# Patient Record
Sex: Male | Born: 1950
Health system: Southern US, Community
[De-identification: ages and names within clinical notes are randomized; demographics above are authoritative.]

## PROBLEM LIST (undated history)

## (undated) DIAGNOSIS — I10 Essential (primary) hypertension: Secondary | ICD-10-CM

## (undated) DIAGNOSIS — M1712 Unilateral primary osteoarthritis, left knee: Secondary | ICD-10-CM

## (undated) DIAGNOSIS — E871 Hypo-osmolality and hyponatremia: Secondary | ICD-10-CM

## (undated) DIAGNOSIS — L602 Onychogryphosis: Secondary | ICD-10-CM

## (undated) DIAGNOSIS — M25561 Pain in right knee: Secondary | ICD-10-CM

## (undated) DIAGNOSIS — D509 Iron deficiency anemia, unspecified: Secondary | ICD-10-CM

## (undated) DIAGNOSIS — I7121 Aneurysm of the ascending aorta, without rupture: Secondary | ICD-10-CM

## (undated) DIAGNOSIS — M25562 Pain in left knee: Secondary | ICD-10-CM

## (undated) HISTORY — DX: Iron deficiency anemia, unspecified: D50.9

## (undated) HISTORY — PX: ORCHIECTOMY: SHX2116

## (undated) HISTORY — DX: Aneurysm of the ascending aorta, without rupture: I71.21

## (undated) HISTORY — DX: Unilateral primary osteoarthritis, left knee: M17.12

## (undated) HISTORY — DX: Onychogryphosis: L60.2

## (undated) HISTORY — DX: Essential (primary) hypertension: I10

## (undated) HISTORY — DX: Hypo-osmolality and hyponatremia: E87.1

## (undated) HISTORY — DX: Pain in right knee: M25.561

## (undated) HISTORY — DX: Pain in left knee: M25.562

---

## 2015-02-12 DIAGNOSIS — M25562 Pain in left knee: Secondary | ICD-10-CM

## 2015-02-12 DIAGNOSIS — M25561 Pain in right knee: Secondary | ICD-10-CM | POA: Insufficient documentation

## 2015-02-12 HISTORY — DX: Pain in right knee: M25.561

## 2015-02-12 HISTORY — DX: Pain in left knee: M25.562

## 2015-03-15 DIAGNOSIS — M1712 Unilateral primary osteoarthritis, left knee: Secondary | ICD-10-CM

## 2015-03-15 HISTORY — DX: Unilateral primary osteoarthritis, left knee: M17.12

## 2016-12-10 DIAGNOSIS — R69 Illness, unspecified: Secondary | ICD-10-CM | POA: Diagnosis not present

## 2017-01-01 DIAGNOSIS — G8929 Other chronic pain: Secondary | ICD-10-CM | POA: Diagnosis not present

## 2017-01-01 DIAGNOSIS — M21161 Varus deformity, not elsewhere classified, right knee: Secondary | ICD-10-CM | POA: Diagnosis not present

## 2017-01-01 DIAGNOSIS — M1711 Unilateral primary osteoarthritis, right knee: Secondary | ICD-10-CM | POA: Diagnosis not present

## 2017-01-01 DIAGNOSIS — M170 Bilateral primary osteoarthritis of knee: Secondary | ICD-10-CM | POA: Diagnosis not present

## 2017-01-01 DIAGNOSIS — M25561 Pain in right knee: Secondary | ICD-10-CM | POA: Diagnosis not present

## 2017-03-17 DIAGNOSIS — R262 Difficulty in walking, not elsewhere classified: Secondary | ICD-10-CM | POA: Diagnosis not present

## 2017-03-17 DIAGNOSIS — M25561 Pain in right knee: Secondary | ICD-10-CM | POA: Diagnosis not present

## 2017-03-17 DIAGNOSIS — M170 Bilateral primary osteoarthritis of knee: Secondary | ICD-10-CM | POA: Diagnosis not present

## 2017-03-17 DIAGNOSIS — M25562 Pain in left knee: Secondary | ICD-10-CM | POA: Diagnosis not present

## 2017-03-19 DIAGNOSIS — Z0000 Encounter for general adult medical examination without abnormal findings: Secondary | ICD-10-CM | POA: Diagnosis not present

## 2017-03-19 DIAGNOSIS — I10 Essential (primary) hypertension: Secondary | ICD-10-CM | POA: Diagnosis not present

## 2017-03-19 DIAGNOSIS — K08409 Partial loss of teeth, unspecified cause, unspecified class: Secondary | ICD-10-CM | POA: Diagnosis not present

## 2017-03-19 DIAGNOSIS — E669 Obesity, unspecified: Secondary | ICD-10-CM | POA: Diagnosis not present

## 2017-03-19 DIAGNOSIS — M25561 Pain in right knee: Secondary | ICD-10-CM | POA: Diagnosis not present

## 2017-03-19 DIAGNOSIS — M25562 Pain in left knee: Secondary | ICD-10-CM | POA: Diagnosis not present

## 2017-03-19 DIAGNOSIS — R0600 Dyspnea, unspecified: Secondary | ICD-10-CM | POA: Diagnosis not present

## 2017-03-19 DIAGNOSIS — Z7982 Long term (current) use of aspirin: Secondary | ICD-10-CM | POA: Diagnosis not present

## 2017-03-19 DIAGNOSIS — Z79899 Other long term (current) drug therapy: Secondary | ICD-10-CM | POA: Diagnosis not present

## 2017-03-19 DIAGNOSIS — Z6835 Body mass index (BMI) 35.0-35.9, adult: Secondary | ICD-10-CM | POA: Diagnosis not present

## 2017-03-26 DIAGNOSIS — M25562 Pain in left knee: Secondary | ICD-10-CM | POA: Diagnosis not present

## 2017-03-26 DIAGNOSIS — M25561 Pain in right knee: Secondary | ICD-10-CM | POA: Diagnosis not present

## 2017-03-26 DIAGNOSIS — M1711 Unilateral primary osteoarthritis, right knee: Secondary | ICD-10-CM | POA: Diagnosis not present

## 2017-03-26 DIAGNOSIS — R262 Difficulty in walking, not elsewhere classified: Secondary | ICD-10-CM | POA: Diagnosis not present

## 2017-03-26 DIAGNOSIS — M170 Bilateral primary osteoarthritis of knee: Secondary | ICD-10-CM | POA: Diagnosis not present

## 2017-03-30 DIAGNOSIS — R69 Illness, unspecified: Secondary | ICD-10-CM | POA: Diagnosis not present

## 2017-03-30 DIAGNOSIS — E785 Hyperlipidemia, unspecified: Secondary | ICD-10-CM | POA: Diagnosis not present

## 2017-03-30 DIAGNOSIS — Z1389 Encounter for screening for other disorder: Secondary | ICD-10-CM | POA: Diagnosis not present

## 2017-03-30 DIAGNOSIS — I10 Essential (primary) hypertension: Secondary | ICD-10-CM | POA: Diagnosis not present

## 2017-03-30 DIAGNOSIS — M170 Bilateral primary osteoarthritis of knee: Secondary | ICD-10-CM | POA: Diagnosis not present

## 2017-03-30 DIAGNOSIS — J449 Chronic obstructive pulmonary disease, unspecified: Secondary | ICD-10-CM | POA: Diagnosis not present

## 2017-03-30 DIAGNOSIS — Z9181 History of falling: Secondary | ICD-10-CM | POA: Diagnosis not present

## 2017-03-30 DIAGNOSIS — R7301 Impaired fasting glucose: Secondary | ICD-10-CM | POA: Diagnosis not present

## 2017-03-30 DIAGNOSIS — Z6835 Body mass index (BMI) 35.0-35.9, adult: Secondary | ICD-10-CM | POA: Diagnosis not present

## 2017-03-31 DIAGNOSIS — M1711 Unilateral primary osteoarthritis, right knee: Secondary | ICD-10-CM | POA: Diagnosis not present

## 2017-03-31 DIAGNOSIS — M25561 Pain in right knee: Secondary | ICD-10-CM | POA: Diagnosis not present

## 2017-04-22 DIAGNOSIS — M25561 Pain in right knee: Secondary | ICD-10-CM | POA: Diagnosis not present

## 2017-04-22 DIAGNOSIS — M1711 Unilateral primary osteoarthritis, right knee: Secondary | ICD-10-CM | POA: Diagnosis not present

## 2017-04-29 DIAGNOSIS — M170 Bilateral primary osteoarthritis of knee: Secondary | ICD-10-CM | POA: Diagnosis not present

## 2017-04-29 DIAGNOSIS — M25562 Pain in left knee: Secondary | ICD-10-CM | POA: Diagnosis not present

## 2017-04-29 DIAGNOSIS — M25561 Pain in right knee: Secondary | ICD-10-CM | POA: Diagnosis not present

## 2017-05-14 DIAGNOSIS — M1712 Unilateral primary osteoarthritis, left knee: Secondary | ICD-10-CM | POA: Diagnosis not present

## 2017-05-14 DIAGNOSIS — M25562 Pain in left knee: Secondary | ICD-10-CM | POA: Diagnosis not present

## 2017-05-21 DIAGNOSIS — M1712 Unilateral primary osteoarthritis, left knee: Secondary | ICD-10-CM | POA: Diagnosis not present

## 2017-05-21 DIAGNOSIS — M25562 Pain in left knee: Secondary | ICD-10-CM | POA: Diagnosis not present

## 2017-06-01 DIAGNOSIS — M25461 Effusion, right knee: Secondary | ICD-10-CM | POA: Diagnosis not present

## 2017-06-01 DIAGNOSIS — M25562 Pain in left knee: Secondary | ICD-10-CM | POA: Diagnosis not present

## 2017-06-01 DIAGNOSIS — M1712 Unilateral primary osteoarthritis, left knee: Secondary | ICD-10-CM | POA: Diagnosis not present

## 2017-06-09 DIAGNOSIS — M25562 Pain in left knee: Secondary | ICD-10-CM | POA: Diagnosis not present

## 2017-06-09 DIAGNOSIS — M1712 Unilateral primary osteoarthritis, left knee: Secondary | ICD-10-CM | POA: Diagnosis not present

## 2017-08-31 DIAGNOSIS — R7301 Impaired fasting glucose: Secondary | ICD-10-CM | POA: Diagnosis not present

## 2017-08-31 DIAGNOSIS — E785 Hyperlipidemia, unspecified: Secondary | ICD-10-CM | POA: Diagnosis not present

## 2017-08-31 DIAGNOSIS — Z125 Encounter for screening for malignant neoplasm of prostate: Secondary | ICD-10-CM | POA: Diagnosis not present

## 2017-10-28 DIAGNOSIS — R69 Illness, unspecified: Secondary | ICD-10-CM | POA: Diagnosis not present

## 2017-12-02 DIAGNOSIS — M25562 Pain in left knee: Secondary | ICD-10-CM | POA: Diagnosis not present

## 2017-12-02 DIAGNOSIS — M170 Bilateral primary osteoarthritis of knee: Secondary | ICD-10-CM | POA: Diagnosis not present

## 2017-12-02 DIAGNOSIS — M25561 Pain in right knee: Secondary | ICD-10-CM | POA: Diagnosis not present

## 2017-12-02 DIAGNOSIS — M25462 Effusion, left knee: Secondary | ICD-10-CM | POA: Diagnosis not present

## 2017-12-02 DIAGNOSIS — M25461 Effusion, right knee: Secondary | ICD-10-CM | POA: Diagnosis not present

## 2017-12-10 DIAGNOSIS — R221 Localized swelling, mass and lump, neck: Secondary | ICD-10-CM | POA: Diagnosis not present

## 2017-12-10 DIAGNOSIS — R0609 Other forms of dyspnea: Secondary | ICD-10-CM | POA: Diagnosis not present

## 2017-12-10 DIAGNOSIS — Z6836 Body mass index (BMI) 36.0-36.9, adult: Secondary | ICD-10-CM | POA: Diagnosis not present

## 2017-12-17 DIAGNOSIS — M1712 Unilateral primary osteoarthritis, left knee: Secondary | ICD-10-CM | POA: Diagnosis not present

## 2017-12-17 DIAGNOSIS — M25562 Pain in left knee: Secondary | ICD-10-CM | POA: Diagnosis not present

## 2017-12-18 DIAGNOSIS — R0609 Other forms of dyspnea: Secondary | ICD-10-CM | POA: Diagnosis not present

## 2017-12-18 DIAGNOSIS — R221 Localized swelling, mass and lump, neck: Secondary | ICD-10-CM | POA: Diagnosis not present

## 2017-12-24 DIAGNOSIS — M25562 Pain in left knee: Secondary | ICD-10-CM | POA: Diagnosis not present

## 2017-12-24 DIAGNOSIS — M1712 Unilateral primary osteoarthritis, left knee: Secondary | ICD-10-CM | POA: Diagnosis not present

## 2017-12-31 DIAGNOSIS — M25562 Pain in left knee: Secondary | ICD-10-CM | POA: Diagnosis not present

## 2017-12-31 DIAGNOSIS — M1712 Unilateral primary osteoarthritis, left knee: Secondary | ICD-10-CM | POA: Diagnosis not present

## 2018-01-07 DIAGNOSIS — M25562 Pain in left knee: Secondary | ICD-10-CM | POA: Diagnosis not present

## 2018-01-07 DIAGNOSIS — M1712 Unilateral primary osteoarthritis, left knee: Secondary | ICD-10-CM | POA: Diagnosis not present

## 2018-01-20 DIAGNOSIS — M25561 Pain in right knee: Secondary | ICD-10-CM | POA: Diagnosis not present

## 2018-01-20 DIAGNOSIS — M25461 Effusion, right knee: Secondary | ICD-10-CM | POA: Diagnosis not present

## 2018-01-20 DIAGNOSIS — M1711 Unilateral primary osteoarthritis, right knee: Secondary | ICD-10-CM | POA: Diagnosis not present

## 2018-01-28 DIAGNOSIS — M25561 Pain in right knee: Secondary | ICD-10-CM | POA: Diagnosis not present

## 2018-01-28 DIAGNOSIS — M1711 Unilateral primary osteoarthritis, right knee: Secondary | ICD-10-CM | POA: Diagnosis not present

## 2018-02-11 DIAGNOSIS — M1711 Unilateral primary osteoarthritis, right knee: Secondary | ICD-10-CM | POA: Diagnosis not present

## 2018-02-11 DIAGNOSIS — M25561 Pain in right knee: Secondary | ICD-10-CM | POA: Diagnosis not present

## 2018-02-18 DIAGNOSIS — M25561 Pain in right knee: Secondary | ICD-10-CM | POA: Diagnosis not present

## 2018-02-18 DIAGNOSIS — M1711 Unilateral primary osteoarthritis, right knee: Secondary | ICD-10-CM | POA: Diagnosis not present

## 2018-06-28 DIAGNOSIS — M25462 Effusion, left knee: Secondary | ICD-10-CM | POA: Diagnosis not present

## 2018-06-28 DIAGNOSIS — M25561 Pain in right knee: Secondary | ICD-10-CM | POA: Diagnosis not present

## 2018-06-28 DIAGNOSIS — M25562 Pain in left knee: Secondary | ICD-10-CM | POA: Diagnosis not present

## 2018-06-28 DIAGNOSIS — M170 Bilateral primary osteoarthritis of knee: Secondary | ICD-10-CM | POA: Diagnosis not present

## 2018-06-28 DIAGNOSIS — M25461 Effusion, right knee: Secondary | ICD-10-CM | POA: Diagnosis not present

## 2018-07-22 ENCOUNTER — Encounter: Payer: Self-pay | Admitting: Cardiology

## 2018-07-22 ENCOUNTER — Ambulatory Visit: Payer: Medicare HMO | Admitting: Cardiology

## 2018-07-22 VITALS — BP 118/64 | HR 92 | Ht 70.0 in | Wt 253.0 lb

## 2018-07-22 DIAGNOSIS — I10 Essential (primary) hypertension: Secondary | ICD-10-CM | POA: Diagnosis not present

## 2018-07-22 DIAGNOSIS — R0609 Other forms of dyspnea: Secondary | ICD-10-CM | POA: Diagnosis not present

## 2018-07-22 DIAGNOSIS — R0600 Dyspnea, unspecified: Secondary | ICD-10-CM

## 2018-07-22 DIAGNOSIS — R0683 Snoring: Secondary | ICD-10-CM

## 2018-07-22 HISTORY — DX: Snoring: R06.83

## 2018-07-22 NOTE — Progress Notes (Signed)
Cardiology Consultation:    Date:  07/22/2018   ID:  Joshua ReinJames Creasey V., DOB 1950-12-12, MRN [id number]  PCP:  Paulina FusiSchultz, Douglas E, MD  Cardiologist:  Gypsy Balsamobert Kember Boch, MD   Referring MD: No ref. provider found   No chief complaint on file. Here to be reestablished as a patient  History of Present Illness:    Joshua ReinJames Trabert V. is a 67 y.o. male who is being seen today for the evaluation of with history of coronary artery disease at the request of No ref. provider found.  Apparently was seen him in our office in 2012 he ended up [facility] he was told to have myocardial infarction then he was sent to [facility] where cardiac catheterization was done apparently no significant lesion was identified and no intervention has been required.  He was told that somebody Mr. had a test that he had done.  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  He described to have some pain in the left side of his neck that happened when he sits in the chair.  He does have problem with blood pressure recent adjustment to the medication has been done and since that time he complained of having dizziness when he gets up very quickly.  No passing out no falls.  Past Medical History:  Diagnosis Date  . Arthralgia of both knees 02/12/2015  . Primary osteoarthritis of left knee 03/15/2015    Past Surgical History:  Procedure Laterality Date  . ORCHIECTOMY      Current Medications: Current Meds  Medication Sig  . aspirin EC 81 MG tablet Take 1 tablet by mouth daily.  . celecoxib (CELEBREX) 200 MG capsule Take 1 capsule by mouth daily.  . chlordiazePOXIDE (LIBRIUM) 25 MG capsule 100 mg 4 times a day Day1, 75 mg 4 times a day Day2, 50 mg 4 times a day Day3, 25 mg 4 times a day Day4   . Garlic 1000 MG CAPS Take 1 capsule by mouth daily.  Marland Kitchen. olmesartan (BENICAR) 40 MG tablet Take 40 mg by mouth daily.  Marland Kitchen. SPIRIVA HANDIHALER 18 MCG inhalation capsule Place 1 puff into inhaler and inhale daily.     Allergies:   Patient has no known allergies.   Social History   Socioeconomic History  . Marital status: Married    Spouse name: Not on file  . Number of children: Not on file  . Years of education: Not on file  . Highest education level: Not on file  Occupational History  . Not on file  Social Needs  . Financial resource strain: Not on file  . Food insecurity:    Worry: Not on file    Inability: Not on file  . Transportation needs:    Medical: Not on file    Non-medical: Not on file  Tobacco Use  . Smoking status: Never Smoker  . Smokeless tobacco: Never Used  Substance and Sexual Activity  . Alcohol use: Not on file  . Drug use: Not on file  . Sexual activity: Not on file  Lifestyle  . Physical activity:    Days per week: Not on file    Minutes per session: Not on file  . Stress: Not on file  Relationships  .  Social connections:    Talks on phone: Not on file    Gets together: Not on file    Attends religious service: Not on file    Active member of club or organization: Not on file    Attends meetings of clubs or organizations: Not on file    Relationship status: Not on file  Other Topics Concern  . Not on file  Social History Narrative  . Not on file     Family History: The patient's family history includes Alzheimer's disease in his father; Heart Problems in his sister; Stroke in his mother. ROS:   Please see the history of present illness.    All 14 point review of systems negative except as described per history of present illness.  EKGs/Labs/Other Studies Reviewed:    The following studies were reviewed today:   EKG:  EKG is normal sinus rhythm normal PR interval left axis deviation nonspecific ST segment changes ordered today.     Recent Labs: No results found for requested labs within last 8760 hours.  Recent Lipid Panel No results found for: CHOL, TRIG, HDL, CHOLHDL, VLDL, LDLCALC, LDLDIRECT  Physical Exam:    VS:  BP 118/64 (BP Location: Right Arm, Patient Position: Sitting, Cuff Size: Normal)   Pulse 92   Ht 5\' 10"  (1.778 m)   Wt 253 lb (114.8 kg)   SpO2 98%   BMI 36.30 kg/m     Wt Readings from Last 3 Encounters:  07/22/18 253 lb (114.8 kg)     GEN:  Well nourished, well developed in no acute distress HEENT: Normal NECK: No JVD; No carotid bruits LYMPHATICS: No lymphadenopathy CARDIAC: RRR, no murmurs, no rubs, no gallops RESPIRATORY:  Clear to auscultation without rales, wheezing or rhonchi  ABDOMEN: Soft, non-tender, non-distended MUSCULOSKELETAL:  No edema; No deformity  SKIN: Warm and dry NEUROLOGIC:  Alert and oriented x 3 PSYCHIATRIC:  Normal affect   ASSESSMENT:    1. Hypertension, unspecified type   2. Essential hypertension   3. Dyspnea on exertion   4. Snoring    PLAN:    In order of problems listed above:  1. Hypertension his blood pressure appears to be well controlled I am concerned about his symptoms dizziness while getting up.  I told him that he may cut his ARB are being in a half.  However I also explained to him what the reasons for his medication and the importance of taking care of his blood pressure well. 2. Snoring was spent great deal of of time talking about the fact that he needs sleep study he said he want to think about it.  I will ask him to have an echocardiogram done to assess left ventricular ejection fraction and more importantly pulmonary pressure. 3. Dyspnea on exertion echocardiogram will be done to assess left ventricular ejection fraction.   Medication Adjustments/Labs and Tests Ordered: Current medicines are reviewed at length with the patient today.  Concerns regarding medicines are outlined above.  Orders Placed This Encounter  Procedures  .  EKG 12-Lead  . ECHOCARDIOGRAM COMPLETE   No orders of the defined types were placed in this encounter.   Signed, Georgeanna Lea, MD, [facility]. 07/22/2018 5:02 PM    [facility]

## 2018-07-22 NOTE — Patient Instructions (Signed)
Medication Instructions:  Your physician recommends that you continue on your current medications as directed. Please refer to the Current Medication list given to you today.   Labwork: None   Testing/Procedures: You had an EKG today.   Your physician has requested that you have an echocardiogram. Echocardiography is a painless test that uses sound waves to create images of your heart. It provides your doctor with information about the size and shape of your heart and how well your heart's chambers and valves are working. This procedure takes approximately one hour. There are no restrictions for this procedure.    Follow-Up: Your physician wants you to follow-up in: 4 months. You will receive a reminder letter in the mail two months in advance. If you don't receive a letter, please call our office to schedule the follow-up appointment.   If you need a refill on your cardiac medications before your next appointment, please call your pharmacy.   Thank you for choosing [facility]! Loree Shehata, RN [phone]   Echocardiogram An echocardiogram, or echocardiography, uses sound waves (ultrasound) to produce an image of your heart. The echocardiogram is simple, painless, obtained within a short period of time, and offers valuable information to your health care provider. The images from an echocardiogram can provide information such as:  Evidence of coronary artery disease (CAD).  Heart size.  Heart muscle function.  Heart valve function.  Aneurysm detection.  Evidence of a past heart attack.  Fluid buildup around the heart.  Heart muscle thickening.  Assess heart valve function.  Tell a health care provider about:  Any allergies you have.  All medicines you are taking, including vitamins, herbs, eye drops, creams, and over-the-counter medicines.  Any problems you or family members have had with anesthetic medicines.  Any blood disorders you have.  Any  surgeries you have had.  Any medical conditions you have.  Whether you are pregnant or may be pregnant. What happens before the procedure? No special preparation is needed. Eat and drink normally. What happens during the procedure?  In order to produce an image of your heart, gel will be applied to your chest and a wand-like tool (transducer) will be moved over your chest. The gel will help transmit the sound waves from the transducer. The sound waves will harmlessly bounce off your heart to allow the heart images to be captured in real-time motion. These images will then be recorded.  You may need an IV to receive a medicine that improves the quality of the pictures. What happens after the procedure? You may return to your normal schedule including diet, activities, and medicines, unless your health care provider tells you otherwise. This information is not intended to replace advice given to you by your health care provider. Make sure you discuss any questions you have with your health care provider. Document Released: 11/14/2000 Document Revised: 07/05/2016 Document Reviewed: 07/25/2013 Elsevier Interactive Patient Education  2017 Elsevier Inc.  

## 2018-09-08 ENCOUNTER — Other Ambulatory Visit: Payer: Self-pay

## 2018-09-08 ENCOUNTER — Ambulatory Visit (INDEPENDENT_AMBULATORY_CARE_PROVIDER_SITE_OTHER): Payer: Medicare HMO

## 2018-09-08 DIAGNOSIS — I10 Essential (primary) hypertension: Secondary | ICD-10-CM

## 2018-09-08 NOTE — Progress Notes (Signed)
Complete echocardiogram has been performed.  Jimmy Amaiyah Nordhoff RDCS, RVT 

## 2018-09-10 ENCOUNTER — Telehealth: Payer: Self-pay | Admitting: Emergency Medicine

## 2018-09-10 DIAGNOSIS — I7121 Aneurysm of the ascending aorta, without rupture: Secondary | ICD-10-CM

## 2018-09-10 DIAGNOSIS — I712 Thoracic aortic aneurysm, without rupture: Secondary | ICD-10-CM

## 2018-09-10 DIAGNOSIS — I10 Essential (primary) hypertension: Secondary | ICD-10-CM

## 2018-09-10 DIAGNOSIS — I7789 Other specified disorders of arteries and arterioles: Secondary | ICD-10-CM

## 2018-09-10 NOTE — Telephone Encounter (Signed)
Patient informed of echocardiogram results and  to have labs drawn and a ct of his aorta per Dr. Bing Matter. Patient verbally understands.

## 2018-09-13 DIAGNOSIS — I712 Thoracic aortic aneurysm, without rupture: Secondary | ICD-10-CM | POA: Diagnosis not present

## 2018-09-13 DIAGNOSIS — I10 Essential (primary) hypertension: Secondary | ICD-10-CM | POA: Diagnosis not present

## 2018-09-14 LAB — BASIC METABOLIC PANEL
BUN / CREAT RATIO: 13 (ref 10–24)
BUN: 9 mg/dL (ref 8–27)
CO2: 23 mmol/L (ref 20–29)
CREATININE: 0.69 mg/dL — AB (ref 0.76–1.27)
Calcium: 8.8 mg/dL (ref 8.6–10.2)
Chloride: 102 mmol/L (ref 96–106)
GFR, EST AFRICAN AMERICAN: 112 mL/min/{1.73_m2} (ref >59)
GFR, EST NON AFRICAN AMERICAN: 97 mL/min/{1.73_m2} (ref >59)
GLUCOSE: 90 mg/dL (ref 65–99)
Potassium: 4 mmol/L (ref 3.5–5.2)
Sodium: 140 mmol/L (ref 134–144)

## 2018-09-23 ENCOUNTER — Ambulatory Visit (INDEPENDENT_AMBULATORY_CARE_PROVIDER_SITE_OTHER)
Admission: RE | Admit: 2018-09-23 | Discharge: 2018-09-23 | Disposition: A | Discharge status: Home / Self Care | Payer: Medicare HMO | Source: Ambulatory Visit | Attending: Cardiology | Admitting: Cardiology

## 2018-09-23 DIAGNOSIS — I712 Thoracic aortic aneurysm, without rupture: Secondary | ICD-10-CM

## 2018-09-23 DIAGNOSIS — I77810 Thoracic aortic ectasia: Secondary | ICD-10-CM | POA: Diagnosis not present

## 2018-09-23 DIAGNOSIS — I7121 Aneurysm of the ascending aorta, without rupture: Secondary | ICD-10-CM

## 2018-09-23 MED ORDER — IOPAMIDOL (ISOVUE-370) INJECTION 76%
100.0000 mL | Freq: Once | INTRAVENOUS | Status: AC | PRN
  Administered 2018-09-23: 100 mL via INTRAVENOUS

## 2018-09-24 ENCOUNTER — Telehealth: Payer: Self-pay | Admitting: Cardiology

## 2018-09-24 NOTE — Telephone Encounter (Signed)
Will route to Dr. Bing Matter for results.

## 2018-09-24 NOTE — Telephone Encounter (Signed)
Mild enlargement of the aortic root , 4.2 cm  Medical therapy

## 2018-09-24 NOTE — Telephone Encounter (Signed)
Please call patient with results of CT done yesterday. If he does not answer please leave a message if no answer.

## 2018-09-24 NOTE — Telephone Encounter (Signed)
Patient informed of results.  

## 2018-10-04 ENCOUNTER — Ambulatory Visit: Payer: Medicare HMO | Admitting: Cardiology

## 2018-10-13 DIAGNOSIS — Z23 Encounter for immunization: Secondary | ICD-10-CM | POA: Diagnosis not present

## 2018-11-25 ENCOUNTER — Encounter: Payer: Self-pay | Admitting: Cardiology

## 2018-11-25 ENCOUNTER — Ambulatory Visit (INDEPENDENT_AMBULATORY_CARE_PROVIDER_SITE_OTHER): Payer: Medicare HMO | Admitting: Cardiology

## 2018-11-25 VITALS — BP 134/78 | HR 83 | Ht 70.0 in | Wt 261.4 lb

## 2018-11-25 DIAGNOSIS — I10 Essential (primary) hypertension: Secondary | ICD-10-CM | POA: Diagnosis not present

## 2018-11-25 DIAGNOSIS — R0609 Other forms of dyspnea: Secondary | ICD-10-CM | POA: Diagnosis not present

## 2018-11-25 DIAGNOSIS — R0600 Dyspnea, unspecified: Secondary | ICD-10-CM

## 2018-11-25 DIAGNOSIS — I712 Thoracic aortic aneurysm, without rupture: Secondary | ICD-10-CM | POA: Diagnosis not present

## 2018-11-25 DIAGNOSIS — R0683 Snoring: Secondary | ICD-10-CM | POA: Diagnosis not present

## 2018-11-25 DIAGNOSIS — I7121 Aneurysm of the ascending aorta, without rupture: Secondary | ICD-10-CM | POA: Insufficient documentation

## 2018-11-25 MED ORDER — ROSUVASTATIN CALCIUM 10 MG PO TABS: 10.0000 mg | ORAL_TABLET | Freq: Every day | ORAL | 3 refills | Status: DC

## 2018-11-25 NOTE — Progress Notes (Signed)
Cardiology Office Note:    Date:  11/25/2018   ID:  Joshua ReinJames Slutsky V., DOB May 06, 1951, MRN [id number]  PCP:  Paulina FusiSchultz, Douglas E, MD  Cardiologist:  Gypsy Balsamobert Artemisia Auvil, MD    Referring MD: Paulina FusiSchultz, Douglas E, MD   Chief Complaint  Patient presents with  . Follow-up  Doing well  History of Present Illness:    Joshua ReinJames Harshfield V. is a 67 y.o. male with remote history of myocardial infarction.  That happened in 2010 he did have some biochemical markers abnormality, he was sent [facility].  Cardiac catheterization was done at that time and he was fine normal coronaries.  He was told that the blood test was mistakenly read.  Denies having any chest pain tightness squeezing pressure burning chest he does have some exertional fatigue and tiredness sometimes will get some twinges in the left side of the chest interestingly he talks today about snoring his wife is in the room and clearly from the description she gave me he does have sleep apnea stop breathing at night chokes move jumps.  We talked at length about this and I strongly advised him to have sleep study he agreed.  Past Medical History:  Diagnosis Date  . Arthralgia of both knees 02/12/2015  . Primary osteoarthritis of left knee 03/15/2015    Past Surgical History:  Procedure Laterality Date  . ORCHIECTOMY      Current Medications: Current Meds  Medication Sig  . aspirin EC 81 MG tablet Take 1 tablet by mouth daily.  . celecoxib (CELEBREX) 200 MG capsule Take 1 capsule by mouth daily.  . Garlic 1000 MG CAPS Take 1 capsule by mouth daily.  Marland Kitchen. olmesartan (BENICAR) 40 MG tablet Take 20 mg by mouth daily.   Marland Kitchen. SPIRIVA HANDIHALER 18 MCG inhalation capsule Place 1 puff into inhaler and inhale daily.     Allergies:   Patient has no known allergies.   Social History   Socioeconomic History  . Marital status: Married    Spouse name: Not on file  . Number of children: Not on file  . Years of education: Not on file   . Highest education level: Not on file  Occupational History  . Not on file  Social Needs  . Financial resource strain: Not on file  . Food insecurity:    Worry: Not on file    Inability: Not on file  . Transportation needs:    Medical: Not on file    Non-medical: Not on file  Tobacco Use  . Smoking status: Never Smoker  . Smokeless tobacco: Never Used  Substance and Sexual Activity  . Alcohol use: Not on file  . Drug use: Not on file  . Sexual activity: Not on file  Lifestyle  . Physical activity:    Days per week: Not on file    Minutes per session: Not on file  . Stress: Not on file  Relationships  . Social connections:    Talks on phone: Not on file    Gets together: Not on file    Attends religious service: Not on file    Active member of club or organization: Not on file    Attends meetings of clubs or organizations: Not on file    Relationship status: Not on file  Other Topics Concern  . Not on file  Social History Narrative  . Not on file     Family History: The patient's family history includes Alzheimer's disease in his  father; Heart Problems in his sister; Stroke in his mother. ROS:   Please see the history of present illness.    All 14 point review of systems negative except as described per history of present illness  EKGs/Labs/Other Studies Reviewed:      Recent Labs: 09/13/2018: BUN 9; Creatinine, Ser 0.69; Potassium 4.0; Sodium 140  Recent Lipid Panel No results found for: CHOL, TRIG, HDL, CHOLHDL, VLDL, LDLCALC, LDLDIRECT  Physical Exam:    VS:  BP 134/78   Pulse 83   Ht 5\' 10"  (1.778 m)   Wt 261 lb 6.4 oz (118.6 kg)   SpO2 96%   BMI 37.51 kg/m     Wt Readings from Last 3 Encounters:  11/25/18 261 lb 6.4 oz (118.6 kg)  07/22/18 253 lb (114.8 kg)     GEN:  Well nourished, well developed in no acute distress HEENT: Normal NECK: No JVD; No carotid bruits LYMPHATICS: No lymphadenopathy CARDIAC: RRR, no murmurs, no rubs, no  gallops RESPIRATORY:  Clear to auscultation without rales, wheezing or rhonchi  ABDOMEN: Soft, non-tender, non-distended MUSCULOSKELETAL:  No edema; No deformity  SKIN: Warm and dry LOWER EXTREMITIES: no swelling NEUROLOGIC:  Alert and oriented x 3 PSYCHIATRIC:  Normal affect   ASSESSMENT:    1. Snoring   2. Dyspnea on exertion   3. Essential hypertension   4. Ascending aortic aneurysm (HCC)    PLAN:    In order of problems listed above:  1. Remote history of questionable coronary artery disease.  Again he tells me that cardiac catheterization showed normal coronaries.  We will continue present management which include aspirin.  His echocardiogram showed preserved left ventricular ejection fraction. 2. Dyslipidemia he tells me his LDL was 98 for somebody with remote history of MI it is obviously too high I will try to put him on small dose of Crestor 10 mg daily. 3.  4. Aortic aneurysm measuring 4.5 cm.  In the future will make arrangements for CT of his chest. 5. Sleep apnea we will schedule him to have a sleep study. 6. Essential hypertension blood pressure appears to be well controlled.   Medication Adjustments/Labs and Tests Ordered: Current medicines are reviewed at length with the patient today.  Concerns regarding medicines are outlined above.  No orders of the defined types were placed in this encounter.  Medication changes: No orders of the defined types were placed in this encounter.   Signed, Georgeanna Leaobert J. Shinita Mac, MD, [facility] 11/25/2018 10:58 AM    [facility]

## 2018-11-25 NOTE — Patient Instructions (Signed)
Medication Instructions:  Your physician has recommended you make the following change in your medication:  Start: Crestor 10 mg daily  If you need a refill on your cardiac medications before your next appointment, please call your pharmacy.   Lab work: None.  If you have labs (blood work) drawn today and your tests are completely normal, you will receive your results only by: Marland Kitchen. MyChart Message (if you have MyChart) OR . A paper copy in the mail If you have any lab test that is abnormal or we need to change your treatment, we will call you to review the results.  Testing/Procedures: Your physician has recommended that you have a sleep study. This test records several body functions during sleep, including: brain activity, eye movement, oxygen and carbon dioxide blood levels, heart rate and rhythm, breathing rate and rhythm, the flow of air through your mouth and nose, snoring, body muscle movements, and chest and belly movement.    Follow-Up: At [facility], you and your health needs are our priority.  As part of our continuing mission to provide you with exceptional heart care, we have created designated Provider Care Teams.  These Care Teams include your primary Cardiologist (physician) and Advanced Practice Providers (APPs -  Physician Assistants and Nurse Practitioners) who all work together to provide you with the care you need, when you need it. You will need a follow up appointment in 4 months.  Please call our office 2 months in advance to schedule this appointment.  You may see No primary care provider on file. or another member of our [facility] Provider Team in [place]: Norman HerrlichBrian Munley, MD . Belva Cromeajan Revankar, MD  Any Other Special Instructions Will Be Listed Below (If Applicable).  Rosuvastatin Tablets What is this medicine? ROSUVASTATIN (roe SOO va sta tin) is known as a HMG-CoA reductase inhibitor or 'statin'. It lowers cholesterol and triglycerides in the blood. This  drug may also reduce the risk of heart attack, stroke, or other health problems in patients with risk factors for heart disease. Diet and lifestyle changes are often used with this drug. This medicine may be used for other purposes; ask your health care provider or pharmacist if you have questions. COMMON BRAND NAME(S): Crestor What should I tell my health care provider before I take this medicine? They need to know if you have any of these conditions: -diabetes -if you often drink alcohol -history of stroke -kidney disease -liver disease -muscle aches or weakness -thyroid disease -an unusual or allergic reaction to rosuvastatin, other medicines, foods, dyes, or preservatives -pregnant or trying to get pregnant -breast-feeding How should I use this medicine? Take this medicine by mouth with a glass of water. Follow the directions on the prescription label. Do not cut, crush or chew this medicine. You can take this medicine with or without food. Take your doses at regular intervals. Do not take your medicine more often than directed. Talk to your pediatrician regarding the use of this medicine in children. While this drug may be prescribed for children as young as 69 years old for selected conditions, precautions do apply. Overdosage: If you think you have taken too much of this medicine contact a poison control center or emergency room at once. NOTE: This medicine is only for you. Do not share this medicine with others. What if I miss a dose? If you miss a dose, take it as soon as you can. If your next dose is to be taken in less than 12  hours, then do not take the missed dose. Take the next dose at your regular time. Do not take double or extra doses. What may interact with this medicine? Do not take this medicine with any of the following medications: -herbal medicines like red yeast rice This medicine may also interact with the following medications: -alcohol -antacids containing  aluminum hydroxide or magnesium hydroxide -cyclosporine -other medicines for high cholesterol -some medicines for HIV infection -warfarin This list may not describe all possible interactions. Give your health care provider a list of all the medicines, herbs, non-prescription drugs, or dietary supplements you use. Also tell them if you smoke, drink alcohol, or use illegal drugs. Some items may interact with your medicine. What should I watch for while using this medicine? Visit your doctor or health care professional for regular check-ups. You may need regular tests to make sure your liver is working properly. Your health care professional may tell you to stop taking this medicine if you develop muscle problems. If your muscle problems do not go away after stopping this medicine, contact your health care professional. Do not become pregnant while taking this medicine. Women should inform their health care professional if they wish to become pregnant or think they might be pregnant. There is a potential for serious side effects to an unborn child. Talk to your health care professional or pharmacist for more information. Do not breast-feed an infant while taking this medicine. This medicine may affect blood sugar levels. If you have diabetes, check with your doctor or health care professional before you change your diet or the dose of your diabetic medicine. If you are going to need surgery or other procedure, tell your doctor that you are using this medicine. This drug is only part of a total heart-health program. Your doctor or a dietician can suggest a low-cholesterol and low-fat diet to help. Avoid alcohol and smoking, and keep a proper exercise schedule. This medicine may cause a decrease in Co-Enzyme Q-10. You should make sure that you get enough Co-Enzyme Q-10 while you are taking this medicine. Discuss the foods you eat and the vitamins you take with your health care professional. What side  effects may I notice from receiving this medicine? Side effects that you should report to your doctor or health care professional as soon as possible: -allergic reactions like skin rash, itching or hives, swelling of the face, lips, or tongue -dark urine -fever -joint pain -muscle cramps, pain -redness, blistering, peeling or loosening of the skin, including inside the mouth -trouble passing urine or change in the amount of urine -unusually weak or tired -yellowing of the eyes or skin Side effects that usually do not require medical attention (report to your doctor or health care professional if they continue or are bothersome): -constipation -heartburn -nausea -stomach gas, pain, upset This list may not describe all possible side effects. Call your doctor for medical advice about side effects. You may report side effects to FDA at 1-800-FDA-1088. Where should I keep my medicine? Keep out of the reach of children. Store at room temperature between 20 and 25 degrees C (68 and 77 degrees F). Keep container tightly closed (protect from moisture). Throw away any unused medicine after the expiration date. NOTE: This sheet is a summary. It may not cover all possible information. If you have questions about this medicine, talk to your doctor, pharmacist, or health care provider.  2019 Elsevier/Gold Standard (2017-07-21 12:42:43)   Sleep Studies A sleep study (polysomnogram) is a  series of tests done while you are sleeping. A sleep study records your brain waves, heart rate, breathing rate, oxygen level, and eye and leg movements. A sleep study helps your health care provider:  See how well you sleep.  Diagnose a sleep disorder.  Determine how severe your sleep disorder is.  Create a plan to treat your sleep disorder. Your health care provider may recommend a sleep study if you:  Feel sleepy on most days.  Snore loudly while sleeping.  Have unusual behaviors while you sleep, such as  walking.  Have brief periods in which you stop breathing during sleep (sleepapnea).  Fall asleep suddenly during the day (narcolepsy).  Have trouble falling asleep or staying asleep (insomnia).  Feel like you need to move your legs when trying to fall asleep (restless legs syndrome).  Move your legs by flexing and extending them regularly while asleep (periodic limb movement disorder).  Act out your dreams while you sleep (sleep behavior disorder).  Feel like you cannot move when you first wake up (sleep paralysis). What tests are part of a sleep study? Most sleep studies record the following during sleep:  Brain activity.  Eye movements.  Heart rate and rhythm.  Breathing rate and rhythm.  Blood-oxygen level.  Blood pressure.  Chest and belly movement as you breathe.  Arm and leg movements.  Snoring or other noises.  Body position. Where are sleep studies done? Sleep studies are done at sleep centers. A sleep center may be inside a hospital, office, or clinic. The room where you have the study may look like a hospital room or a hotel room. The health care providers doing the study may come in and out of the room during the study. Most of the time, they will be in another room monitoring your test as you sleep. How are sleep studies done? Most sleep studies are done during a normal period of time for a full night of sleep. You will arrive at the study center in the evening and go home in the morning. Before the test  Bring your pajamas and toothbrush with you to the sleep study.  Do not have caffeine on the day of your sleep study.  Do not drink alcohol on the day of your sleep study.  Your health care provider will let you know if you should stop taking any of your regular medicines before the test. During the test      Round, sticky patches with sensors attached to recording wires (electrodes) are placed on your scalp, face, chest, and limbs.  Wires from  all the electrodes and sensors run from your bed to a computer. The wires can be taken off and put back on if you need to get out of bed to go to the bathroom.  A sensor is placed over your nose to measure airflow.  A finger clip is put on your finger or ear to measure your blood oxygen level (pulse oximetry).  A belt is placed around your belly and a belt is placed around your chest to measure breathing movements.  If you have signs of the sleep disorder called sleep apnea during your test, you may get a treatment mask to wear for the second half of the night. ? The mask provides positive airway pressure (PAP) to help you breathe better during sleep. This may greatly improve your sleep apnea. ? You will then have all tests done again with the mask in place to see if your measurements  and recordings change. After the test  A medical doctor who specializes in sleep will evaluate the results of your sleep study and share them with you and your primary health care provider.  Based on your results, your medical history, and a physical exam, you may be diagnosed with a sleep disorder, such as: ? Sleep apnea. ? Restless legs syndrome. ? Sleep-related behavior disorder. ? Sleep-related movement disorders. ? Sleep-related seizure disorders.  Your health care team will help determine your treatment options based on your diagnosis. This may include: ? Improving your sleep habits (sleep hygiene). ? Wearing a continuous positive airway pressure (CPAP) or bi-level positive airway pressure (BPAP) mask. ? Wearing an oral device at night to improve breathing and reduce snoring. ? Taking medicines. Follow these instructions at home:  Take over-the-counter and prescription medicines only as told by your health care provider.  If you are instructed to use a CPAP or BPAP mask, make sure you use it nightly as directed.  Make any lifestyle changes that your health care provider recommends.  If you were  given a device to open your airway while you sleep, use it only as told by your health care provider.  Do not use any tobacco products, such as cigarettes, chewing tobacco, and e-cigarettes. If you need help quitting, ask your health care provider.  Keep all follow-up visits as told by your health care provider. This is important. Summary  A sleep study (polysomnogram) is a series of tests done while you are sleeping. It shows how well you sleep.  Most sleep studies are done over one full night of sleep. You will arrive at the study center in the evening and go home in the morning.  If you have signs of the sleep disorder called sleep apnea during your test, you may get a treatment mask to wear for the second half of the night.  A medical doctor who specializes in sleep will evaluate the results of your sleep study and share them with your primary health care provider. This information is not intended to replace advice given to you by your health care provider. Make sure you discuss any questions you have with your health care provider. Document Released: 05/24/2003 Document Revised: 12/15/2017 Document Reviewed: 12/15/2017 Elsevier Interactive Patient Education  [employer].  '

## 2018-11-30 ENCOUNTER — Telehealth: Payer: Self-pay | Admitting: *Deleted

## 2018-11-30 NOTE — Telephone Encounter (Signed)
-----   Message from Hayley R Bowman, RN sent at 11/25/2018 11:10 AM EST ----- Regarding: Please precert Please precert sleep study for patient for snoring and sleep apnea per Dr. Krasowski.   Thanks  Hayley RN   

## 2018-11-30 NOTE — Telephone Encounter (Signed)
PA submitted via [employer] web Portal for sleep study.

## 2018-12-02 DIAGNOSIS — E785 Hyperlipidemia, unspecified: Secondary | ICD-10-CM | POA: Diagnosis not present

## 2018-12-02 DIAGNOSIS — Z125 Encounter for screening for malignant neoplasm of prostate: Secondary | ICD-10-CM | POA: Diagnosis not present

## 2018-12-02 DIAGNOSIS — Z0000 Encounter for general adult medical examination without abnormal findings: Secondary | ICD-10-CM | POA: Diagnosis not present

## 2018-12-02 DIAGNOSIS — M170 Bilateral primary osteoarthritis of knee: Secondary | ICD-10-CM | POA: Diagnosis not present

## 2018-12-02 DIAGNOSIS — R7301 Impaired fasting glucose: Secondary | ICD-10-CM | POA: Diagnosis not present

## 2018-12-02 DIAGNOSIS — I10 Essential (primary) hypertension: Secondary | ICD-10-CM | POA: Diagnosis not present

## 2018-12-02 DIAGNOSIS — J449 Chronic obstructive pulmonary disease, unspecified: Secondary | ICD-10-CM | POA: Diagnosis not present

## 2018-12-02 DIAGNOSIS — Z6838 Body mass index (BMI) 38.0-38.9, adult: Secondary | ICD-10-CM | POA: Diagnosis not present

## 2018-12-02 DIAGNOSIS — R188 Other ascites: Secondary | ICD-10-CM | POA: Diagnosis not present

## 2018-12-06 ENCOUNTER — Telehealth: Payer: Self-pay | Admitting: *Deleted

## 2018-12-06 NOTE — Telephone Encounter (Signed)
Staff message sent to [place] informing her auth received from [place]. Ok to schedule sleep study. Authorization # [id number]. Valid 11/30/18 to 05/29/19.

## 2018-12-06 NOTE — Telephone Encounter (Signed)
Patient scheduled for sleep sleep study at [facility] on 01/06/2019 at 8 pm. Left message informing patient of this on voicemail per dpr. Will continue to contact patient

## 2018-12-06 NOTE — Telephone Encounter (Signed)
-----   Message from Lita Mains, RN sent at 11/25/2018 11:10 AM EST ----- Regarding: Please precert Please precert sleep study for patient for snoring and sleep apnea per Dr. Bing Matter.   Thanks  [employer] RN

## 2018-12-09 NOTE — Telephone Encounter (Signed)
Patient informed of appointment of sleep study.

## 2019-01-06 ENCOUNTER — Encounter (HOSPITAL_BASED_OUTPATIENT_CLINIC_OR_DEPARTMENT_OTHER): Payer: Medicare HMO

## 2019-03-07 DIAGNOSIS — I10 Essential (primary) hypertension: Secondary | ICD-10-CM | POA: Diagnosis not present

## 2019-03-07 DIAGNOSIS — E785 Hyperlipidemia, unspecified: Secondary | ICD-10-CM | POA: Diagnosis not present

## 2019-03-07 DIAGNOSIS — J449 Chronic obstructive pulmonary disease, unspecified: Secondary | ICD-10-CM | POA: Diagnosis not present

## 2019-03-07 DIAGNOSIS — R7301 Impaired fasting glucose: Secondary | ICD-10-CM | POA: Diagnosis not present

## 2019-03-07 DIAGNOSIS — M170 Bilateral primary osteoarthritis of knee: Secondary | ICD-10-CM | POA: Diagnosis not present

## 2019-03-21 ENCOUNTER — Telehealth: Payer: Self-pay | Admitting: Cardiology

## 2019-03-21 NOTE — Telephone Encounter (Signed)
Virtual Visit Pre-Appointment Phone Call  Steps For Call:  1. Confirm consent - "In the setting of the current Covid19 crisis, you are scheduled for a (phone or video) visit with your provider on (date) at (time).  Just as we do with many in-office visits, in order for you to participate in this visit, we must obtain consent.  If you'd like, I can send this to your mychart (if signed up) or email for you to review.  Otherwise, I can obtain your verbal consent now.  All virtual visits are billed to your insurance company just like a normal visit would be.  By agreeing to a virtual visit, we'd like you to understand that the technology does not allow for your provider to perform an examination, and thus may limit your provider's ability to fully assess your condition. If your provider identifies any concerns that need to be evaluated in person, we will make arrangements to do so.  Finally, though the technology is pretty good, we cannot assure that it will always work on either your or our end, and in the setting of a video visit, we may have to convert it to a phone-only visit.  In either situation, we cannot ensure that we have a secure connection.  Are you willing to proceed?" STAFF: Did the patient verbally acknowledge consent to telehealth visit? Document YES/NO here: YES  2. Confirm the BEST phone number to call the day of the visit by including in appointment notes  3. Give patient instructions for WebEx/MyChart download to smartphone as below or Doximity/Doxy.me if video visit (depending on what platform provider is using)  4. Advise patient to be prepared with their blood pressure, heart rate, weight, any heart rhythm information, their current medicines, and a piece of paper and pen handy for any instructions they may receive the day of their visit  5. Inform patient they will receive a phone call 15 minutes prior to their appointment time (may be from unknown caller ID) so they should be  prepared to answer  6. Confirm that appointment type is correct in Epic appointment notes (VIDEO vs PHONE)     TELEPHONE CALL NOTE  Joshua FriskJames Castelli V. has been deemed a candidate for a follow-up tele-health visit to limit community exposure during the Covid-19 pandemic. I spoke with the patient via phone to ensure availability of phone/video source, confirm preferred email & phone number, and discuss instructions and expectations.  I reminded Joshua FriskJames Endres V. to be prepared with any vital sign and/or heart rhythm information that could potentially be obtained via home monitoring, at the time of his visit. I reminded Joshua FriskJames Sinopoli V. to expect a phone call at the time of his visit if his visit.  [name] 03/21/2019 2:35 PM   INSTRUCTIONS FOR DOWNLOADING THE WEBEX APP TO SMARTPHONE  - If Apple, ask patient to go to App Store and type in WebEx in the search bar. Download [employer] Meetings, the blue/green circle. If Android, go to [employer] Play Store and type in [employer]Ex in the search bar. The app is free but as with any other app downloads, their phone may require them to verify saved payment information or Apple/Android password.  - The patient does NOT have to create an account. - On the day of the visit, the assist will walk the patient through joining the meeting with the meeting number/password.  INSTRUCTIONS FOR DOWNLOADING THE MYCHART APP TO SMARTPHONE  - The patient must first make  sure to have activated MyChart and know their login information - If Apple, go to [employer] and type in MyChart in the search bar and download the app. If Android, ask patient to go to [employer] and type in [place] in the search bar and download the app. The app is free but as with any other app downloads, their phone may require them to verify saved payment information or Apple/Android password.  - The patient will need to then log into the app with their MyChart username and password, and  select [facility] as their healthcare provider to link the account. When it is time for your visit, go to the MyChart app, find appointments, and click Begin Video Visit. Be sure to Select Allow for your device to access the Microphone and Camera for your visit. You will then be connected, and your provider will be with you shortly.  **If they have any issues connecting, or need assistance please contact MyChart service desk [phone])**  **If using a computer, in order to ensure the best quality for their visit they will need to use either of the following Internet Browsers: [employer], or Google Chrome**  IF USING DOXIMITY or DOXY.ME - The patient will receive a link just prior to their visit, either by text or email (to be determined day of appointment depending on if it's doxy.me or Doximity).     FULL LENGTH CONSENT FOR TELE-HEALTH VISIT   I hereby voluntarily request, consent and authorize [facility] and its employed or contracted physicians, physician assistants, nurse practitioners or other licensed health care professionals (the Practitioner), to provide me with telemedicine health care services (the Services") as deemed necessary by the treating Practitioner. I acknowledge and consent to receive the Services by the Practitioner via telemedicine. I understand that the telemedicine visit will involve communicating with the Practitioner through live audiovisual communication technology and the disclosure of certain medical information by electronic transmission. I acknowledge that I have been given the opportunity to request an in-person assessment or other available alternative prior to the telemedicine visit and am voluntarily participating in the telemedicine visit.  I understand that I have the right to withhold or withdraw my consent to the use of telemedicine in the course of my care at any time, without affecting my right to future care or treatment, and that  the Practitioner or I may terminate the telemedicine visit at any time. I understand that I have the right to inspect all information obtained and/or recorded in the course of the telemedicine visit and may receive copies of available information for a reasonable fee.  I understand that some of the potential risks of receiving the Services via telemedicine include:   Delay or interruption in medical evaluation due to technological equipment failure or disruption;  Information transmitted may not be sufficient (e.g. poor resolution of images) to allow for appropriate medical decision making by the Practitioner; and/or   In rare instances, security protocols could fail, causing a breach of personal health information.  Furthermore, I acknowledge that it is my responsibility to provide information about my medical history, conditions and care that is complete and accurate to the best of my ability. I acknowledge that Practitioner's advice, recommendations, and/or decision may be based on factors not within their control, such as incomplete or inaccurate data provided by me or distortions of diagnostic images or specimens that may result from electronic transmissions. I understand that the practice of medicine is not an exact  science and that Practitioner makes no warranties or guarantees regarding treatment outcomes. I acknowledge that I will receive a copy of this consent concurrently upon execution via email to the email address I last provided but may also request a printed copy by calling the office of [facility].    I understand that my insurance will be billed for this visit.   I have read or had this consent read to me.  I understand the contents of this consent, which adequately explains the benefits and risks of the Services being provided via telemedicine.   I have been provided ample opportunity to ask questions regarding this consent and the Services and have had my questions answered to  my satisfaction.  I give my informed consent for the services to be provided through the use of telemedicine in my medical care  By participating in this telemedicine visit I agree to the above.     Cardiac Questionnaire:    Since your last visit or hospitalization:    1. Have you been having new or worsening chest pain? no   2. Have you been having new or worsening shortness of breath? no 3. Have you been having new or worsening leg swelling, wt gain, or increase in abdominal girth (pants fitting more tightly)? no   4. Have you had any passing out spells? no    *A YES to any of these questions would result in the appointment being kept. *If all the answers to these questions are NO, we should indicate that given the current situation regarding the worldwide coronarvirus pandemic, at the recommendation of the CDC, we are looking to limit gatherings in our waiting area, and thus will reschedule their appointment beyond four weeks from today.   _____________   COVID-19 Pre-Screening Questions:   Do you currently have a fever? no (yes = cancel and refer to pcp for e-visit)  Have you recently travelled on a cruise, internationally, or to [place], [place], [place], [place], [place], or [place], [place]) ? no (yes = cancel, stay home, monitor symptoms, and contact pcp or initiate e-visit if symptoms develop)  Have you been in contact with someone that is currently pending confirmation of Covid19 testing or has been confirmed to have the Covid19 virus?  no (yes = cancel, stay home, away from tested individual, monitor symptoms, and contact pcp or initiate e-visit if symptoms develop)  Are you currently experiencing fatigue or cough? no (yes = pt should be prepared to have a mask placed at the time of their visit).

## 2019-03-31 ENCOUNTER — Other Ambulatory Visit: Payer: Self-pay

## 2019-03-31 ENCOUNTER — Encounter: Payer: Self-pay | Admitting: Cardiology

## 2019-03-31 ENCOUNTER — Telehealth (INDEPENDENT_AMBULATORY_CARE_PROVIDER_SITE_OTHER): Payer: Medicare HMO | Admitting: Cardiology

## 2019-03-31 DIAGNOSIS — I7121 Aneurysm of the ascending aorta, without rupture: Secondary | ICD-10-CM

## 2019-03-31 DIAGNOSIS — R0609 Other forms of dyspnea: Secondary | ICD-10-CM

## 2019-03-31 DIAGNOSIS — R0600 Dyspnea, unspecified: Secondary | ICD-10-CM

## 2019-03-31 DIAGNOSIS — I10 Essential (primary) hypertension: Secondary | ICD-10-CM

## 2019-03-31 DIAGNOSIS — I712 Thoracic aortic aneurysm, without rupture: Secondary | ICD-10-CM

## 2019-03-31 NOTE — Progress Notes (Signed)
Virtual Visit via Telephone Note   This visit type was conducted due to national recommendations for restrictions regarding the COVID-19 Pandemic (e.g. social distancing) in an effort to limit this patient's exposure and mitigate transmission in our community.  Due to his co-morbid illnesses, this patient is at least at moderate risk for complications without adequate follow up.  This format is felt to be most appropriate for this patient at this time.  The patient did not have access to video technology/had technical difficulties with video requiring transitioning to audio format only (telephone).  All issues noted in this document were discussed and addressed.  No physical exam could be performed with this format.  Please refer to the patient's chart for his  consent to telehealth for [facility].  Evaluation Performed:  Follow-up visit  This visit type was conducted due to national recommendations for restrictions regarding the COVID-19 Pandemic (e.g. social distancing).  This format is felt to be most appropriate for this patient at this time.  All issues noted in this document were discussed and addressed.  No physical exam was performed (except for noted visual exam findings with Video Visits).  Please refer to the patient's chart (MyChart message for video visits and phone note for telephone visits) for the patient's consent to telehealth [facility].  Date:  03/31/2019  ID: Joshua ReinJames Woods V., DOB 07/29/1951, MRN [id number]   Patient Location: [address] [place] [id number]   [facility]   [facility]  PCP:  Paulina FusiSchultz, Douglas E, MD  Cardiologist:  Gypsy Balsamobert Lin Hackmann, MD     Chief Complaint: Doing well  History of Present Illness:    Joshua ReinJames Antenucci V. is a 68 y.o. male  who presents via audio/video conferencing for a telehealth visit today.  With past medical history significant for some questionable myocardial infarction.  He also  got ascending octagon was last measurements in October of last year 4.5 cm.  Also hypertension, snoring, dyslipidemia we talked today about his problems overall he is doing fine but complain about the pain in his knees he used to get some injections for his knees which helps but now because of coronavirus situation he cannot get it, therefore he is not able to exercise.  Described to have some exertional shortness of breath but overall seems to be doing well no chest pain, tightness, pressure, burning in the chest no palpitations no dizziness.   The patient does not have symptoms concerning for COVID-19 infection (fever, chills, cough, or new SHORTNESS OF BREATH).    Prior CV studies:   The following studies were reviewed today:       Past Medical History:  Diagnosis Date  . Arthralgia of both knees 02/12/2015  . Primary osteoarthritis of left knee 03/15/2015    Past Surgical History:  Procedure Laterality Date  . ORCHIECTOMY       Current Meds  Medication Sig  . albuterol (VENTOLIN HFA) 108 (90 Base) MCG/ACT inhaler Inhale 2 puffs into the lungs as needed.  Marland Kitchen. aspirin 325 MG EC tablet Take 162.5 mg by mouth daily.  . Garlic 1000 MG CAPS Take 1 capsule by mouth daily.  Marland Kitchen. olmesartan (BENICAR) 40 MG tablet Take 20 mg by mouth daily.   Marland Kitchen. SPIRIVA HANDIHALER 18 MCG inhalation capsule Place 1 puff into inhaler and inhale daily.      Family History: The patient's family history includes Alzheimer's disease in his father; Heart Problems in his sister; Stroke  in his mother.   ROS:   Please see the history of present illness.     All other systems reviewed and are negative.   Labs/Other Tests and Data Reviewed:     Recent Labs: 09/13/2018: BUN 9; Creatinine, Ser 0.69; Potassium 4.0; Sodium 140  Recent Lipid Panel No results found for: CHOL, TRIG, HDL, CHOLHDL, VLDL, LDLCALC, LDLDIRECT    Exam:    Vital Signs:  There were no vitals taken for this visit.    Wt Readings  from Last 3 Encounters:  11/25/18 261 lb 6.4 oz (118.6 kg)  07/22/18 253 lb (114.8 kg)     Well nourished, well developed in no acute distress. Alert awake oriented x3.  With talking over the phone.  He got no technical ability to establish video link therefore this is only phone conversation.  He is happy to be able to talk to me he does have a lot of questions.  Diagnosis for this visit:   1. Essential hypertension   2. Ascending aortic aneurysm (HCC)   3. Dyspnea on exertion      ASSESSMENT & PLAN:    1.  Essential hypertension he does not have blood pressure monitor at home so he cannot check it I told him that somebody with high blood pressure especially with aneurysm must have blood pressure monitor and I strongly advised him to get 1.  I also told him to call us when we check some blood pressure.  He is scheduled also to see his primary care physician through drive-through and he will have blood pressure checked then. 2.  Ascending aortic aneurysm measuring 4.5 cm.  In a October we will repeat echocardiogram to recheck the size of the arteries. 3.  Dyspnea on exertion described to have some while he is cutting grass.  Again echocardiogram will be done in October to check his left ventricular ejection fraction but most likely is a combination of deconditioning related to the fact that he cannot exercise because of chronic arthritis in his knees. 4.  Dyslipidemia he scheduled to see his primary care physician who will do fasting lipid profile will wait for it.  He try some statin previously but complain of having pain in his knees however he does have pain in his knees even if he does not take statin so we will clearly need to balance risks and benefits in my opinion he can benefit from statin.  We will see what his fasting lipid profile will be to decide about aggressiveness of this therapy.  COVID-19 Education: The signs and symptoms of COVID-19 were discussed with the patient and how  to seek care for testing (follow up with PCP or arrange E-visit).  The importance of social distancing was discussed today.  Patient Risk:   After full review of this patients clinical status, I feel that they are at least moderate risk at this time.  Time:   Today, I have spent 15 minutes with the patient with telehealth technology discussing pt health issues.  I spent 5 minutes reviewing her chart before the visit.  Visit was finished at 8:24 AM.    Medication Adjustments/Labs and Tests Ordered: Current medicines are reviewed at length with the patient today.  Concerns regarding medicines are outlined above.  No orders of the defined types were placed in this encounter.  Medication changes: No orders of the defined types were placed in this encounter.    Disposition: Follow-up in 5 months  Signed, Georgeanna Lea,  MD, [facility] 03/31/2019 8:26 AM    [facility]

## 2019-03-31 NOTE — Patient Instructions (Signed)
Medication Instructions:  Your physician recommends that you continue on your current medications as directed. Please refer to the Current Medication list given to you today.  If you need a refill on your cardiac medications before your next appointment, please call your pharmacy.   Lab work: None If you have labs (blood work) drawn today and your tests are completely normal, you will receive your results only by: . MyChart Message (if you have MyChart) OR . A paper copy in the mail If you have any lab test that is abnormal or we need to change your treatment, we will call you to review the results.  Testing/Procedures: None  Follow-Up: At [facility], you and your health needs are our priority.  As part of our continuing mission to provide you with exceptional heart care, we have created designated Provider Care Teams.  These Care Teams include your primary Cardiologist (physician) and Advanced Practice Providers (APPs -  Physician Assistants and Nurse Practitioners) who all work together to provide you with the care you need, when you need it. You will need a follow up appointment in 5 months Any Other Special Instructions Will Be Listed Below (If Applicable).    

## 2019-04-06 DIAGNOSIS — M25462 Effusion, left knee: Secondary | ICD-10-CM | POA: Diagnosis not present

## 2019-04-06 DIAGNOSIS — M25561 Pain in right knee: Secondary | ICD-10-CM | POA: Diagnosis not present

## 2019-04-06 DIAGNOSIS — M25562 Pain in left knee: Secondary | ICD-10-CM | POA: Diagnosis not present

## 2019-04-06 DIAGNOSIS — M25461 Effusion, right knee: Secondary | ICD-10-CM | POA: Diagnosis not present

## 2019-04-06 DIAGNOSIS — M170 Bilateral primary osteoarthritis of knee: Secondary | ICD-10-CM | POA: Diagnosis not present

## 2019-07-21 DIAGNOSIS — R7301 Impaired fasting glucose: Secondary | ICD-10-CM | POA: Diagnosis not present

## 2019-07-21 DIAGNOSIS — E785 Hyperlipidemia, unspecified: Secondary | ICD-10-CM | POA: Diagnosis not present

## 2019-07-22 ENCOUNTER — Encounter: Payer: Self-pay | Admitting: Cardiology

## 2019-08-30 ENCOUNTER — Telehealth: Payer: Medicare HMO | Admitting: Cardiology

## 2019-09-26 ENCOUNTER — Ambulatory Visit: Payer: Medicare HMO | Admitting: Cardiology

## 2019-09-26 DIAGNOSIS — Z23 Encounter for immunization: Secondary | ICD-10-CM | POA: Diagnosis not present

## 2019-11-17 ENCOUNTER — Ambulatory Visit: Payer: Medicare HMO | Admitting: Cardiology

## 2020-03-23 ENCOUNTER — Telehealth: Payer: Self-pay | Admitting: Cardiology

## 2020-03-23 DIAGNOSIS — I10 Essential (primary) hypertension: Secondary | ICD-10-CM

## 2020-03-23 DIAGNOSIS — R0609 Other forms of dyspnea: Secondary | ICD-10-CM

## 2020-03-23 DIAGNOSIS — R0600 Dyspnea, unspecified: Secondary | ICD-10-CM

## 2020-03-23 NOTE — Telephone Encounter (Signed)
Pt c/o Shortness Of Breath: STAT if SOB developed within the last 24 hours or pt is noticeably SOB on the phone  1. Are you currently SOB (can you hear that pt is SOB on the phone)? no  2. How long have you been experiencing SOB? Worse in last 3-4 months  3. Are you SOB when sitting or when up moving around? Up moving around - has to pace himself.  4. Are you currently experiencing any other symptoms? Lightheaded    Dr. Bing Matter does not have any openings until June in [place]. He wants to know if he can have an echo scheduled.

## 2020-03-23 NOTE — Telephone Encounter (Signed)
I am routing this to Dr. Bing Matter to see if he has any further advice or recommendations and to see if he would like for this patient to get the echo done as the patient is requesting.

## 2020-03-24 IMAGING — CT CT ANGIO CHEST
3 of 8 series · 18 of 46 positions shown · IV contrast (iopamidol)
Comparison: Echocardiography report on 09/08/2018

CLINICAL DATA: Dilatation of the ascending thoracic aorta by
echocardiography.

EXAM:
CT ANGIOGRAPHY CHEST WITH CONTRAST
TECHNIQUE: Multidetector CT imaging of the chest was performed using the
standard protocol during bolus administration of intravenous
contrast. Multiplanar CT image reconstructions and MIPs were
obtained to evaluate the vascular anatomy.
CONTRAST:  100mL Y3UQYF-VU1 IOPAMIDOL (Y3UQYF-VU1) INJECTION 76%

[Series 4: aorta 3.0 i31f 2 · axial · 0.74mm/px · z∈[-302,-26]mm · 13 of 108 slices shown]
[im 8/108  lung]
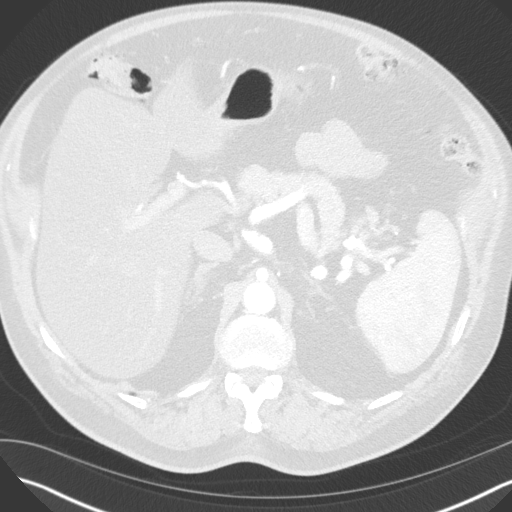
[im 16/108  soft-tissue]
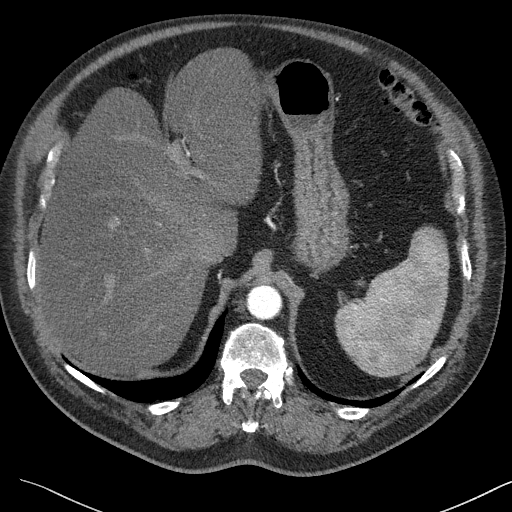
[im 23/108  lung]
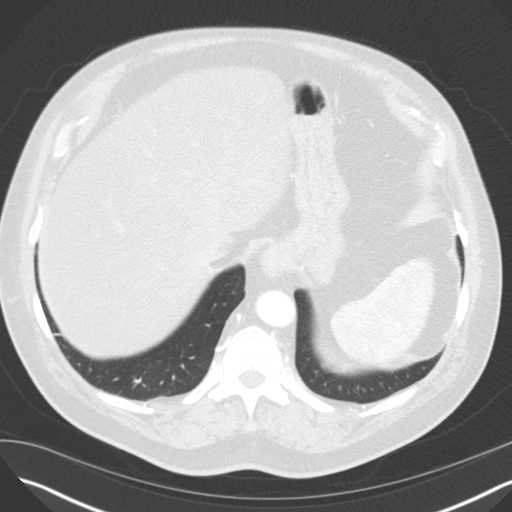
[im 31/108  soft-tissue]
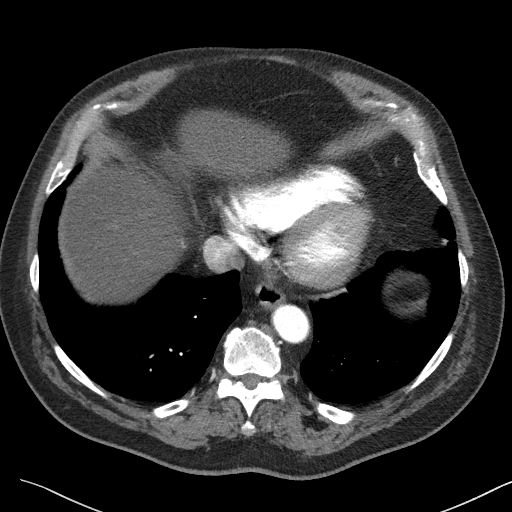
[im 39/108  lung]
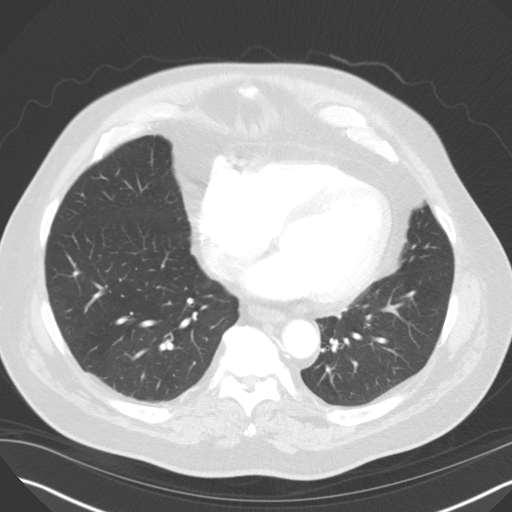
[im 46/108  soft-tissue]
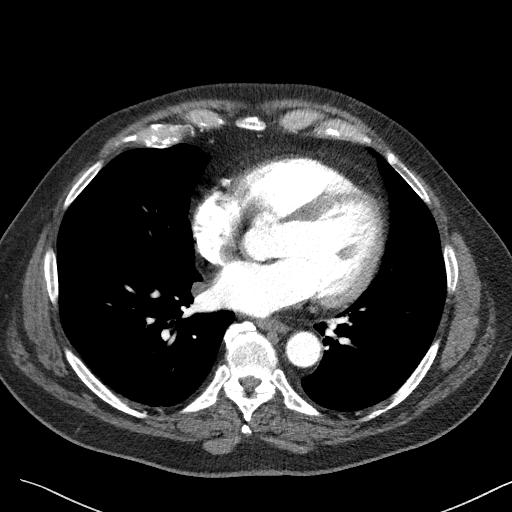
[im 54/108  lung]
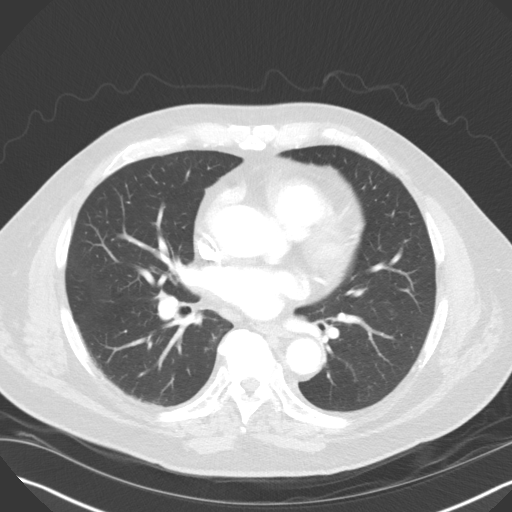
[im 62/108  soft-tissue]
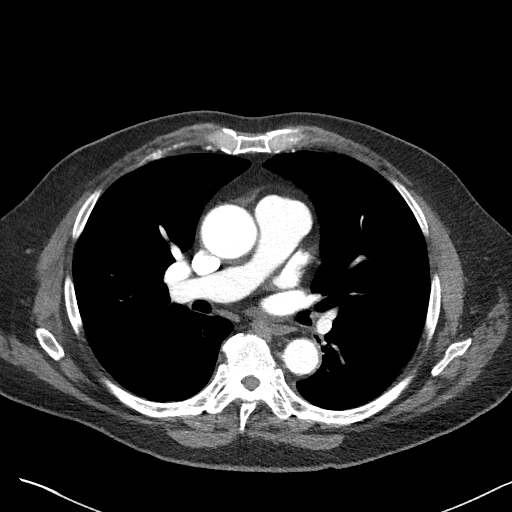
[im 69/108  lung]
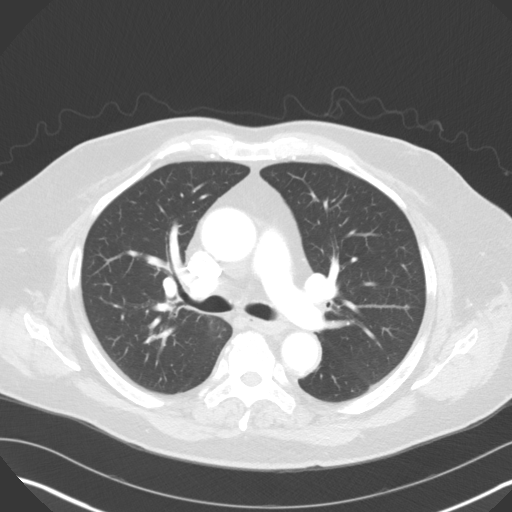
[im 77/108  soft-tissue]
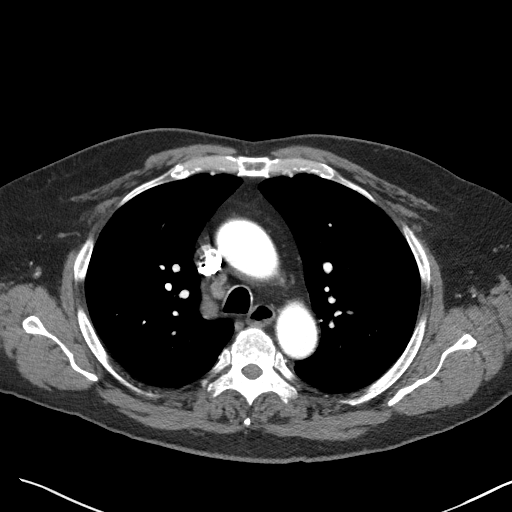
[im 85/108  lung]
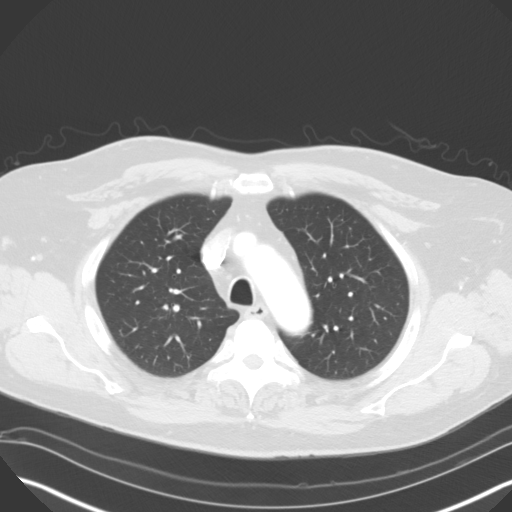
[im 92/108  soft-tissue]
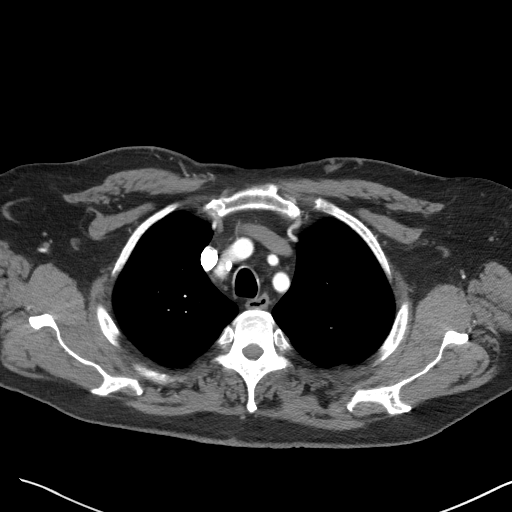
[im 100/108  lung]
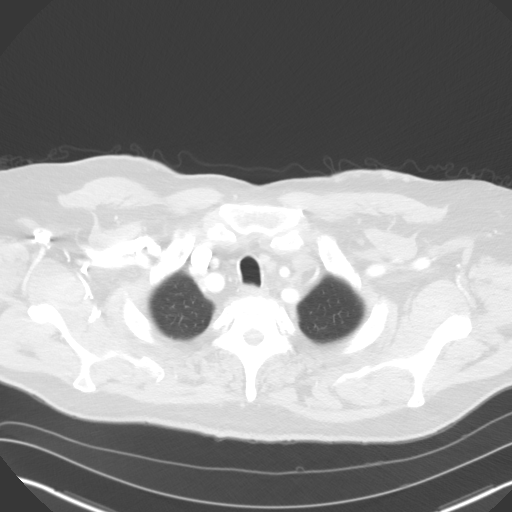

[Series 5: lung · axial · 0.79mm/px · z∈[-284,-242]mm · 2 of 102 slices shown]
[im 8/102  soft-tissue]
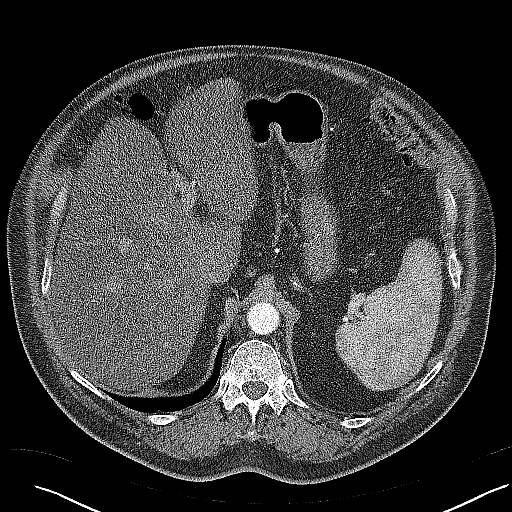
[im 22/102  soft-tissue]
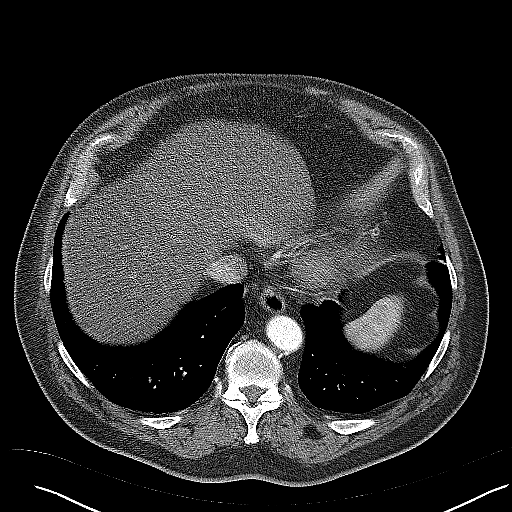

[Series 7: coronals · coronal · 0.64mm/px · 3 of 131 slices shown]
[im 33/131  soft-tissue]
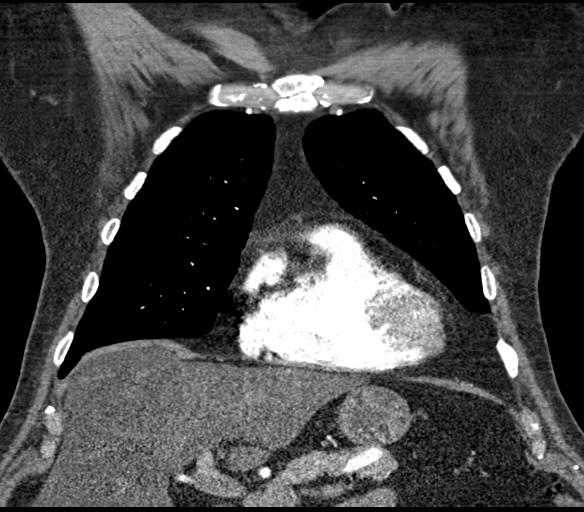
[im 66/131  soft-tissue]
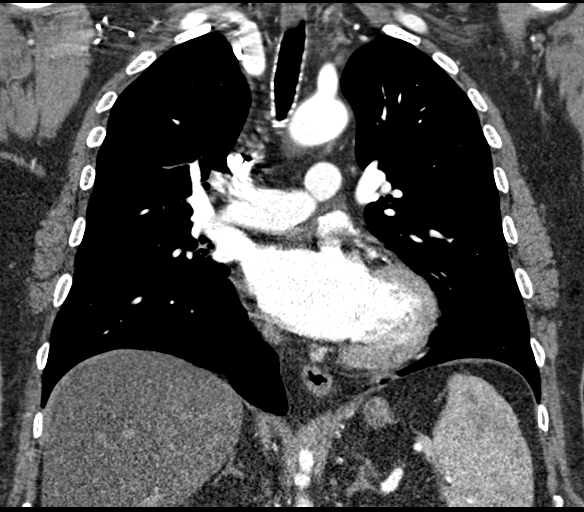
[im 98/131  soft-tissue]
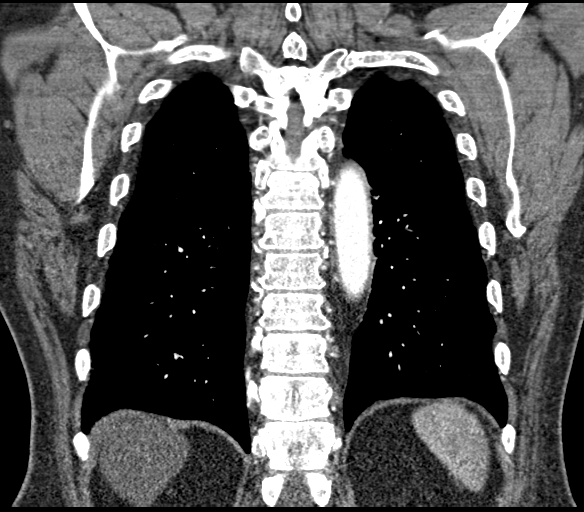

[18 of 46 positions shown; findings below may reference images not displayed]

FINDINGS: Cardiovascular: The aortic root is mildly dilated at the level of
the sinuses of Valsalva and measures approximately 4.3-4.4 cm. No
asymmetric dilatation of any of the aortic sinuses appreciated. The
ascending thoracic aorta is top-normal in caliber/minimally dilated
measuring 4.0-4.1 cm. The aortic arch measures 3.1 cm and the
descending thoracic aorta 2.5 cm. No evidence of aortic dissection.
Proximal great vessels show normal branching anatomy and patency.

The heart size is normal. The left ventricular cavity may be mildly
dilated. No evidence of pericardial fluid. Calcified plaque is noted
in the distribution of the LAD and diagonal branches. Central
pulmonary arteries are normal in caliber.

Mediastinum/Nodes: No mediastinal, hilar or axillary lymphadenopathy
identified.

Lungs/Pleura: There is no evidence of pulmonary edema,
consolidation, pneumothorax, nodule or pleural fluid.

Upper Abdomen: The liver demonstrates diffuse steatosis.

Musculoskeletal: No chest wall abnormality. No acute or significant
osseous findings.

Review of the MIP images confirms the above findings.
IMPRESSION: 1. Mildly dilated aortic root measuring 4.3-4.4 cm at the level of
the sinuses of Valsalva. The ascending thoracic aorta is
top-normal/minimally dilated measuring 4.0-4.1 cm.
2. Recommend annual imaging followup by CTA or MRA. This
recommendation follows 9999
ACCF/AHA/AATS/ACR/ASA/SCA/GIPSANI RADOVI/CHAPAL/JARRYD/TERIANA Guidelines for the
Diagnosis and Management of Patients with Thoracic Aortic Disease.
Circulation. 9999; 121: [id number]
3. Coronary atherosclerosis with calcified plaque in the
distribution of the LAD.
4. Diffuse hepatic steatosis.

Aortic aneurysm NOS ([id number]-HXF.A).

## 2020-03-25 NOTE — Telephone Encounter (Signed)
He has he should have echocardiogram done, please also schedule proBNP as well as Chem-7

## 2020-03-26 NOTE — Telephone Encounter (Signed)
Spoke with the patient and let him know Dr. Vanetta Shawl recommendations. He states that he will come in to get the lab work done and will wait to hear from scheduling in regards to the echocardiogram.   No other issues or concerns noted.    Encouraged patient to call back with any questions or concerns.

## 2020-04-05 DIAGNOSIS — I10 Essential (primary) hypertension: Secondary | ICD-10-CM | POA: Diagnosis not present

## 2020-04-05 DIAGNOSIS — R0600 Dyspnea, unspecified: Secondary | ICD-10-CM | POA: Diagnosis not present

## 2020-04-06 LAB — BASIC METABOLIC PANEL
BUN/Creatinine Ratio: 12 (ref 10–24)
BUN: 8 mg/dL (ref 8–27)
CO2: 25 mmol/L (ref 20–29)
Calcium: 9.3 mg/dL (ref 8.6–10.2)
Chloride: 100 mmol/L (ref 96–106)
Creatinine, Ser: 0.68 mg/dL — ABNORMAL LOW (ref 0.76–1.27)
GFR calc Af Amer: 111 mL/min/{1.73_m2} (ref >59)
GFR calc non Af Amer: 96 mL/min/{1.73_m2} (ref >59)
Glucose: 111 mg/dL — ABNORMAL HIGH (ref 65–99)
Potassium: 4.7 mmol/L (ref 3.5–5.2)
Sodium: 138 mmol/L (ref 134–144)

## 2020-04-06 LAB — PRO B NATRIURETIC PEPTIDE: NT-Pro BNP: 182 pg/mL (ref 0–376)

## 2020-04-10 ENCOUNTER — Telehealth: Payer: Self-pay | Admitting: Cardiology

## 2020-04-10 NOTE — Telephone Encounter (Signed)
Patient informed of results.  

## 2020-04-10 NOTE — Telephone Encounter (Signed)
Patient returning call for lab results. Transferred call to the nurse. 

## 2020-04-13 ENCOUNTER — Other Ambulatory Visit: Payer: Self-pay

## 2020-04-13 ENCOUNTER — Ambulatory Visit (INDEPENDENT_AMBULATORY_CARE_PROVIDER_SITE_OTHER): Payer: Medicare HMO

## 2020-04-13 DIAGNOSIS — R0600 Dyspnea, unspecified: Secondary | ICD-10-CM

## 2020-04-13 NOTE — Progress Notes (Signed)
Complete echocardiogram performed.  Jimmy Danise Dehne RDCS, RVT  

## 2020-05-01 ENCOUNTER — Other Ambulatory Visit: Payer: Self-pay

## 2020-05-01 ENCOUNTER — Ambulatory Visit: Payer: Medicare HMO | Admitting: Cardiology

## 2020-05-01 ENCOUNTER — Encounter: Payer: Self-pay | Admitting: Cardiology

## 2020-05-01 VITALS — BP 152/88 | HR 93 | Ht 70.0 in | Wt 268.2 lb

## 2020-05-01 DIAGNOSIS — R42 Dizziness and giddiness: Secondary | ICD-10-CM | POA: Diagnosis not present

## 2020-05-01 DIAGNOSIS — I712 Thoracic aortic aneurysm, without rupture: Secondary | ICD-10-CM | POA: Diagnosis not present

## 2020-05-01 DIAGNOSIS — R002 Palpitations: Secondary | ICD-10-CM

## 2020-05-01 DIAGNOSIS — I420 Dilated cardiomyopathy: Secondary | ICD-10-CM

## 2020-05-01 DIAGNOSIS — I10 Essential (primary) hypertension: Secondary | ICD-10-CM | POA: Diagnosis not present

## 2020-05-01 DIAGNOSIS — R0609 Other forms of dyspnea: Secondary | ICD-10-CM

## 2020-05-01 DIAGNOSIS — I7121 Aneurysm of the ascending aorta, without rupture: Secondary | ICD-10-CM

## 2020-05-01 DIAGNOSIS — R0600 Dyspnea, unspecified: Secondary | ICD-10-CM

## 2020-05-01 HISTORY — DX: Dilated cardiomyopathy: I42.0

## 2020-05-01 NOTE — Progress Notes (Signed)
Cardiology Office Note:    Date:  05/01/2020   ID:  Joshua Rein., DOB 19-Jun-1951, MRN [id number]  PCP:  Paulina Fusi, MD  Cardiologist:  Gypsy Balsam, MD    Referring MD: Paulina Fusi, MD   Chief Complaint  Patient presents with  . Follow-up    Discuss Echo Results     History of Present Illness:    Joshua Thornton is a 69 y.o. male with past medical history significant for questionable myocardial infarction in the past, ascending aortic aneurysm last measurements of 45 mm, essential hypertension, dyslipidemia, snoring.  I did echocardiogram on him which revealed diminished ejection fraction about 45%, also significant left ventricle hypertrophy.  The purpose of discussion today was to see what to do with the situation.  I want him to increase dose of ARB, however, he is reluctant because of dizziness.  He described what appears to be orthostatic hypotension when he get up to get dizzy but also he can be sitting or laying down to get episode of dizziness.  I am worried she may have some significant arrhythmia, therefore, I will ask him to have monitor.  We will watch him for a week.  Denies have any chest pain tightness squeezing pressure burning chest described to have some exertional shortness of breath.  He understand that obesity is a problem and he thinks weight loss will bring him some relief.  And I concur with that.  Past Medical History:  Diagnosis Date  . Arthralgia of both knees 02/12/2015  . Primary osteoarthritis of left knee 03/15/2015    Past Surgical History:  Procedure Laterality Date  . ORCHIECTOMY      Current Medications: Current Meds  Medication Sig  . albuterol (VENTOLIN HFA) 108 (90 Base) MCG/ACT inhaler Inhale 2 puffs into the lungs as needed.  Marland Kitchen aspirin 325 MG EC tablet Take 162.5 mg by mouth daily.  . Garlic 1000 MG CAPS Take 1 capsule by mouth daily.  Marland Kitchen olmesartan (BENICAR) 40 MG tablet Take 20 mg by mouth daily.   Marland Kitchen SPIRIVA  HANDIHALER 18 MCG inhalation capsule Place 1 puff into inhaler and inhale daily.     Allergies:   Patient has no known allergies.   Social History   Socioeconomic History  . Marital status: Married    Spouse name: Not on file  . Number of children: Not on file  . Years of education: Not on file  . Highest education level: Not on file  Occupational History  . Not on file  Tobacco Use  . Smoking status: Never Smoker  . Smokeless tobacco: Never Used  Substance and Sexual Activity  . Alcohol use: Not on file  . Drug use: Not on file  . Sexual activity: Not on file  Other Topics Concern  . Not on file  Social History Narrative  . Not on file   Social Determinants of Health   Financial Resource Strain:   . Difficulty of Paying Living Expenses:   Food Insecurity:   . Worried About Programme researcher, broadcasting/film/video in the Last Year:   . Barista in the Last Year:   Transportation Needs:   . Freight forwarder (Medical):   Marland Kitchen Lack of Transportation (Non-Medical):   Physical Activity:   . Days of Exercise per Week:   . Minutes of Exercise per Session:   Stress:   . Feeling of Stress :   Social Connections:   . Frequency of Communication  with Friends and Family:   . Frequency of Social Gatherings with Friends and Family:   . Attends Religious Services:   . Active Member of Clubs or Organizations:   . Attends Archivist Meetings:   Marland Kitchen Marital Status:      Family History: The patient's family history includes Alzheimer's disease in his father; Heart Problems in his sister; Stroke in his mother. ROS:   Please see the history of present illness.    All 14 point review of systems negative except as described per history of present illness  EKGs/Labs/Other Studies Reviewed:      Recent Labs: 04/05/2020: BUN 8; Creatinine, Ser 0.68; NT-Pro BNP 182; Potassium 4.7; Sodium 138  Recent Lipid Panel No results found for: CHOL, TRIG, HDL, CHOLHDL, VLDL, LDLCALC,  LDLDIRECT  Physical Exam:    VS:  BP (!) 152/88   Pulse 93   Ht 5\' 10"  (1.778 m)   Wt 268 lb 3.2 oz (121.7 kg)   SpO2 95%   BMI 38.48 kg/m     Wt Readings from Last 3 Encounters:  05/01/20 268 lb 3.2 oz (121.7 kg)  11/25/18 261 lb 6.4 oz (118.6 kg)  07/22/18 253 lb (114.8 kg)     GEN:  Well nourished, well developed in no acute distress HEENT: Normal NECK: No JVD; No carotid bruits LYMPHATICS: No lymphadenopathy CARDIAC: RRR, no murmurs, no rubs, no gallops RESPIRATORY:  Clear to auscultation without rales, wheezing or rhonchi  ABDOMEN: Soft, non-tender, non-distended MUSCULOSKELETAL:  No edema; No deformity  SKIN: Warm and dry LOWER EXTREMITIES: no swelling NEUROLOGIC:  Alert and oriented x 3 PSYCHIATRIC:  Normal affect   ASSESSMENT:    1. Essential hypertension   2. Ascending aortic aneurysm (HCC)   3. Palpitations   4. Dyspnea on exertion   5. Dizziness    PLAN:    In order of problems listed above:  1. Essential hypertension blood pressure on the higher side I will increase dose of medication however he is reluctant because of dizziness.  I told him we will get a put monitor on him for a week to see if he got any significant arrhythmia if not we will adjust his medication for blood pressure.  He does have significant left ventricle hypertrophy on echocardiogram. 2. Ascending aortic aneurysm stable 45 mm we will continue monitoring.  We will try to get his blood pressure under control. 3. Palpitations: We will put monitor on to see if he got any significant arrhythmia. 4. Dyslipidemia: I gave him prescription for Crestor but he never took it he was afraid to take it because of pain in his knees that he had.  I spoke in length to him and advised him to keep taking that medication.   Medication Adjustments/Labs and Tests Ordered: Current medicines are reviewed at length with the patient today.  Concerns regarding medicines are outlined above.  Orders Placed This  Encounter  Procedures  . LONG TERM MONITOR (3-14 DAYS)  . EKG 12-Lead   Medication changes: No orders of the defined types were placed in this encounter.   Signed, Park Liter, MD, [facility] 05/01/2020 2:25 PM    [facility]

## 2020-05-01 NOTE — Patient Instructions (Signed)
Medication Instructions:  Your physician recommends that you continue on your current medications as directed. Please refer to the Current Medication list given to you today.  *If you need a refill on your cardiac medications before your next appointment, please call your pharmacy*   Lab Work: None. If you have labs (blood work) drawn today and your tests are completely normal, you will receive your results only by: . MyChart Message (if you have MyChart) OR . A paper copy in the mail If you have any lab test that is abnormal or we need to change your treatment, we will call you to review the results.   Testing/Procedures: A zio monitor was ordered today. It will remain on for 7 days. You will then return monitor and event diary in provided box. It takes 1-2 weeks for report to be downloaded and returned to us. We will call you with the results. If monitor falls off or has orange flashing light, please call Zio for further instructions.      Follow-Up: At [facility], you and your health needs are our priority.  As part of our continuing mission to provide you with exceptional heart care, we have created designated Provider Care Teams.  These Care Teams include your primary Cardiologist (physician) and Advanced Practice Providers (APPs -  Physician Assistants and Nurse Practitioners) who all work together to provide you with the care you need, when you need it.  We recommend signing up for the patient portal called "MyChart".  Sign up information is provided on this After Visit Summary.  MyChart is used to connect with patients for Virtual Visits (Telemedicine).  Patients are able to view lab/test results, encounter notes, upcoming appointments, etc.  Non-urgent messages can be sent to your provider as well.   To learn more about what you can do with MyChart, go to [email or web address].com.    Your next appointment:   2 month(s)  The format for your next appointment:   In  Person  Provider:   Robert Krasowski, MD   Other Instructions   

## 2020-05-08 ENCOUNTER — Ambulatory Visit (INDEPENDENT_AMBULATORY_CARE_PROVIDER_SITE_OTHER): Payer: Medicare HMO

## 2020-05-08 DIAGNOSIS — R002 Palpitations: Secondary | ICD-10-CM | POA: Diagnosis not present

## 2020-06-04 DIAGNOSIS — R002 Palpitations: Secondary | ICD-10-CM | POA: Diagnosis not present

## 2020-06-20 ENCOUNTER — Telehealth: Payer: Self-pay

## 2020-06-20 NOTE — Telephone Encounter (Signed)
Spoke with patient regarding results and recommendation.  Patient verbalizes understanding and is agreeable to plan of care. Advised patient to call back with any issues or concerns.  

## 2020-06-20 NOTE — Telephone Encounter (Signed)
-----   Message from Georgeanna Lea, MD sent at 06/20/2020  9:55 AM EDT ----- He needs to have follow-up appointment.  Does have multiple runs of nonsustained V. tach that appointment should happen within the next few weeks

## 2020-06-21 ENCOUNTER — Ambulatory Visit: Payer: Medicare HMO | Admitting: Cardiology

## 2020-06-21 ENCOUNTER — Other Ambulatory Visit: Payer: Self-pay

## 2020-06-21 ENCOUNTER — Encounter: Payer: Self-pay | Admitting: Cardiology

## 2020-06-21 VITALS — BP 142/88 | HR 92 | Ht 70.0 in | Wt 273.0 lb

## 2020-06-21 DIAGNOSIS — I472 Ventricular tachycardia: Secondary | ICD-10-CM

## 2020-06-21 DIAGNOSIS — I420 Dilated cardiomyopathy: Secondary | ICD-10-CM

## 2020-06-21 DIAGNOSIS — I4729 Other ventricular tachycardia: Secondary | ICD-10-CM

## 2020-06-21 DIAGNOSIS — I10 Essential (primary) hypertension: Secondary | ICD-10-CM | POA: Diagnosis not present

## 2020-06-21 DIAGNOSIS — I7121 Aneurysm of the ascending aorta, without rupture: Secondary | ICD-10-CM

## 2020-06-21 DIAGNOSIS — R0600 Dyspnea, unspecified: Secondary | ICD-10-CM | POA: Diagnosis not present

## 2020-06-21 DIAGNOSIS — R0609 Other forms of dyspnea: Secondary | ICD-10-CM

## 2020-06-21 DIAGNOSIS — I712 Thoracic aortic aneurysm, without rupture: Secondary | ICD-10-CM | POA: Diagnosis not present

## 2020-06-21 HISTORY — DX: Other ventricular tachycardia: I47.29

## 2020-06-21 NOTE — Addendum Note (Signed)
Addended by: Lita Mains on: 06/21/2020 04:01 PM   Modules accepted: Orders

## 2020-06-21 NOTE — Patient Instructions (Signed)
Medication Instructions:  Your physician recommends that you continue on your current medications as directed. Please refer to the Current Medication list given to you today.  *If you need a refill on your cardiac medications before your next appointment, please call your pharmacy*   Lab Work: None.  If you have labs (blood work) drawn today and your tests are completely normal, you will receive your results only by: . MyChart Message (if you have MyChart) OR . A paper copy in the mail If you have any lab test that is abnormal or we need to change your treatment, we will call you to review the results.   Testing/Procedures:   [facility] [address] [place], [place] [address] Phone:  [phone]    Please arrive 15 minutes prior to your appointment time for registration and insurance purposes.  The test will take approximately 3 to 4 hours to complete; you may bring reading material.  If someone comes with you to your appointment, they will need to remain in the main lobby due to limited space in the testing area. **If you are pregnant or breastfeeding, please notify the nuclear lab prior to your appointment**  How to prepare for your Myocardial Perfusion Test: . Do not eat or drink 3 hours prior to your test, except you may have water. . Do not consume products containing caffeine (regular or decaffeinated) 12 hours prior to your test. (ex: coffee, chocolate, sodas, tea). . Do bring a list of your current medications with you.  If not listed below, you may take your medications as normal. . Do wear comfortable clothes (no dresses or overalls) and walking shoes, tennis shoes preferred (No heels or open toe shoes are allowed). . Do NOT wear cologne, perfume, aftershave, or lotions (deodorant is allowed). . If these instructions are not followed, your test will have to be rescheduled.  Please report to [address] for your test.  If you have  questions or concerns about your appointment, you can call the [facility] at [phone].  If you cannot keep your appointment, please provide 24 hours notification to the Nuclear Lab, to avoid a possible $50 charge to your account.    Follow-Up: At [facility], you and your health needs are our priority.  As part of our continuing mission to provide you with exceptional heart care, we have created designated Provider Care Teams.  These Care Teams include your primary Cardiologist (physician) and Advanced Practice Providers (APPs -  Physician Assistants and Nurse Practitioners) who all work together to provide you with the care you need, when you need it.  We recommend signing up for the patient portal called "MyChart".  Sign up information is provided on this After Visit Summary.  MyChart is used to connect with patients for Virtual Visits (Telemedicine).  Patients are able to view lab/test results, encounter notes, upcoming appointments, etc.  Non-urgent messages can be sent to your provider as well.   To learn more about what you can do with MyChart, go to [email or web address].com.    Your next appointment:   1 month(s)  The format for your next appointment:   In Person  Provider:   Robert Krasowski, MD   Other Instructions    Cardiac Nuclear Scan A cardiac nuclear scan is a test that measures blood flow to the heart when a person is resting and when he or she is exercising. The test looks for problems such as:    Not enough blood reaching a portion of the heart.  The heart muscle not working normally. You may need this test if:  You have heart disease.  You have had abnormal lab results.  You have had heart surgery or a balloon procedure to open up blocked arteries (angioplasty).  You have chest pain.  You have shortness of breath. In this test, a radioactive dye (tracer) is injected into your bloodstream. After the tracer has traveled  to your heart, an imaging device is used to measure how much of the tracer is absorbed by or distributed to various areas of your heart. This procedure is usually done at a hospital and takes 2-4 hours. Tell a health care provider about:  Any allergies you have.  All medicines you are taking, including vitamins, herbs, eye drops, creams, and over-the-counter medicines.  Any problems you or family members have had with anesthetic medicines.  Any blood disorders you have.  Any surgeries you have had.  Any medical conditions you have.  Whether you are pregnant or may be pregnant. What are the risks? Generally, this is a safe procedure. However, problems may occur, including:  Serious chest pain and heart attack. This is only a risk if the stress portion of the test is done.  Rapid heartbeat.  Sensation of warmth in your chest. This usually passes quickly.  Allergic reaction to the tracer. What happens before the procedure?  Ask your health care provider about changing or stopping your regular medicines. This is especially important if you are taking diabetes medicines or blood thinners.  Follow instructions from your health care provider about eating or drinking restrictions.  Remove your jewelry on the day of the procedure. What happens during the procedure?  An IV will be inserted into one of your veins.  Your health care provider will inject a small amount of radioactive tracer through the IV.  You will wait for 20-40 minutes while the tracer travels through your bloodstream.  Your heart activity will be monitored with an electrocardiogram (ECG).  You will lie down on an exam table.  Images of your heart will be taken for about 15-20 minutes.  You may also have a stress test. For this test, one of the following may be done: ? You will exercise on a treadmill or stationary bike. While you exercise, your heart's activity will be monitored with an ECG, and your blood  pressure will be checked. ? You will be given medicines that will increase blood flow to parts of your heart. This is done if you are unable to exercise.  When blood flow to your heart has peaked, a tracer will again be injected through the IV.  After 20-40 minutes, you will get back on the exam table and have more images taken of your heart.  Depending on the type of tracer used, scans may need to be repeated 3-4 hours later.  Your IV line will be removed when the procedure is over. The procedure may vary among health care providers and hospitals. What happens after the procedure?  Unless your health care provider tells you otherwise, you may return to your normal schedule, including diet, activities, and medicines.  Unless your health care provider tells you otherwise, you may increase your fluid intake. This will help to flush the contrast dye from your body. Drink enough fluid to keep your urine pale yellow.  Ask your health care provider, or the department that is doing the test: ? When will my results   be ready? ? How will I get my results? Summary  A cardiac nuclear scan measures the blood flow to the heart when a person is resting and when he or she is exercising.  Tell your health care provider if you are pregnant.  Before the procedure, ask your health care provider about changing or stopping your regular medicines. This is especially important if you are taking diabetes medicines or blood thinners.  After the procedure, unless your health care provider tells you otherwise, increase your fluid intake. This will help flush the contrast dye from your body.  After the procedure, unless your health care provider tells you otherwise, you may return to your normal schedule, including diet, activities, and medicines. This information is not intended to replace advice given to you by your health care provider. Make sure you discuss any questions you have with your health care  provider. Document Revised: 05/03/2018 Document Reviewed: 05/03/2018 Elsevier Patient Education  2020 Elsevier Inc.   

## 2020-06-21 NOTE — Progress Notes (Signed)
Cardiology Office Note:    Date:  06/21/2020   ID:  Joshua Thornton., DOB Jan 18, 1951, MRN [id number]  PCP:  Joshua Fusi, MD  Cardiologist:  Joshua Balsam, MD    Referring MD: Joshua Fusi, MD   No chief complaint on file. I am doing fine  History of Present Illness:    Joshua Thornton is a 69 y.o. male with past medical history significant for cardiomyopathy latest estimation ejection fraction 45 to 50%, essential hypertension, ascending octagon rhythm, dyspnea on exertion.  He did wear monitor which showed surprisingly multiple runs of nonsustained ventricular tachycardia.  He denies having any syncopal episodes but described to have some episodes of lightheadedness that last for a few seconds.  He also had 1 episode of situation that he almost passed out, that was at [facility].  He actually ended up calling his wife Joshua Thornton came and helped him to get home.  That entire sensation lasted for about half an hour.  Does have some pain in the left side of his chest which is not related to exercise lasting only for short period of time  Past Medical History:  Diagnosis Date  . Arthralgia of both knees 02/12/2015  . Primary osteoarthritis of left knee 03/15/2015    Past Surgical History:  Procedure Laterality Date  . ORCHIECTOMY      Current Medications: Current Meds  Medication Sig  . albuterol (VENTOLIN HFA) 108 (90 Base) MCG/ACT inhaler Inhale 2 puffs into the lungs as needed.  Joshua Kitchen aspirin 81 MG EC tablet Take 81 mg by mouth daily.   . Garlic 1000 MG CAPS Take 1 capsule by mouth daily.  Joshua Kitchen olmesartan (BENICAR) 40 MG tablet Take 40 mg by mouth daily.      Allergies:   Patient has no known allergies.   Social History   Socioeconomic History  . Marital status: Married    Spouse name: Not on file  . Number of children: Not on file  . Years of education: Not on file  . Highest education level: Not on file  Occupational History  . Not on file  Tobacco Use  .  Smoking status: Never Smoker  . Smokeless tobacco: Never Used  Vaping Use  . Vaping Use: Never used  Substance and Sexual Activity  . Alcohol use: Yes    Alcohol/week: 42.0 standard drinks    Types: 42 Cans of beer per week  . Drug use: Never  . Sexual activity: Not on file  Other Topics Concern  . Not on file  Social History Narrative  . Not on file   Social Determinants of Health   Financial Resource Strain:   . Difficulty of Paying Living Expenses:   Food Insecurity:   . Worried About Programme researcher, broadcasting/film/video in the Last Year:   . Barista in the Last Year:   Transportation Needs:   . Freight forwarder (Medical):   Joshua Kitchen Lack of Transportation (Non-Medical):   Physical Activity:   . Days of Exercise per Week:   . Minutes of Exercise per Session:   Stress:   . Feeling of Stress :   Social Connections:   . Frequency of Communication with Friends and Family:   . Frequency of Social Gatherings with Friends and Family:   . Attends Religious Services:   . Active Member of Clubs or Organizations:   . Attends Banker Meetings:   Joshua Kitchen Marital Status:      Family  History: The patient's family history includes Alzheimer's disease in his father; Heart Problems in his sister; Stroke in his mother. ROS:   Please see the history of present illness.    All 14 point review of systems negative except as described per history of present illness  EKGs/Labs/Other Studies Reviewed:      Recent Labs: 04/05/2020: BUN 8; Creatinine, Ser 0.68; NT-Pro BNP 182; Potassium 4.7; Sodium 138  Recent Lipid Panel No results found for: CHOL, TRIG, HDL, CHOLHDL, VLDL, LDLCALC, LDLDIRECT  Physical Exam:    VS:  BP (!) 142/88 (BP Location: Left Arm)   Pulse 92   Ht 5\' 10"  (1.778 m)   Wt (!) 273 lb (123.8 kg)   SpO2 95%   BMI 39.17 kg/m     Wt Readings from Last 3 Encounters:  06/21/20 (!) 273 lb (123.8 kg)  05/01/20 268 lb 3.2 oz (121.7 kg)  11/25/18 261 lb 6.4 oz (118.6 kg)      GEN:  Well nourished, well developed in no acute distress HEENT: Normal NECK: No JVD; No carotid bruits LYMPHATICS: No lymphadenopathy CARDIAC: RRR, no murmurs, no rubs, no gallops RESPIRATORY:  Clear to auscultation without rales, wheezing or rhonchi  ABDOMEN: Soft, non-tender, non-distended MUSCULOSKELETAL:  No edema; No deformity  SKIN: Warm and dry LOWER EXTREMITIES: no swelling NEUROLOGIC:  Alert and oriented x 3 PSYCHIATRIC:  Normal affect   ASSESSMENT:    1. Essential hypertension   2. Nonsustained ventricular tachycardia (HCC)   3. Dilated cardiomyopathy (HCC)   4. Ascending aortic aneurysm (HCC)   5. Dyspnea on exertion    PLAN:    In order of problems listed above:  1. Nonsustained ventricular tachycardia this is a new diagnosis, obviously very concerning sign.  I will ask him to have an stress test to rule out ischemia and then after that referral to EP will be done.  I am reviewed with him on any medication at this stage.  He can benefit from beta-blocker for multiple reasons but does not want to take it on top of that with his history of COPD it is difficult. 2. Essential hypertension blood pressure seems to be controlled we will continue present management. 3. Cardiomyopathy with ejection fraction 45 to 50% only on ARB.  Does not want to go on beta-blocker. 4. Ascending aortic aneurysm.  Stable. 5. Dyspnea on exertion stable   Medication Adjustments/Labs and Tests Ordered: Current medicines are reviewed at length with the patient today.  Concerns regarding medicines are outlined above.  No orders of the defined types were placed in this encounter.  Medication changes: No orders of the defined types were placed in this encounter.   Signed, 11/27/18, MD, [facility] 06/21/2020 3:56 PM    [facility]

## 2020-06-26 ENCOUNTER — Telehealth (HOSPITAL_COMMUNITY): Payer: Self-pay | Admitting: Cardiology

## 2020-06-26 NOTE — Telephone Encounter (Signed)
Patient called and cancelled his Myocardial Perfusion test due to financial reasons and did not wish to reschedule. Order will be removed from the WQ and if patient calls at later date to reschedule we can reinstate the order.

## 2020-07-10 ENCOUNTER — Ambulatory Visit: Payer: Medicare HMO | Admitting: Cardiology

## 2020-07-26 ENCOUNTER — Ambulatory Visit: Payer: Medicare HMO | Admitting: Cardiology

## 2020-08-24 DIAGNOSIS — Z23 Encounter for immunization: Secondary | ICD-10-CM | POA: Diagnosis not present

## 2020-08-24 DIAGNOSIS — J449 Chronic obstructive pulmonary disease, unspecified: Secondary | ICD-10-CM | POA: Diagnosis not present

## 2020-08-24 DIAGNOSIS — E785 Hyperlipidemia, unspecified: Secondary | ICD-10-CM | POA: Diagnosis not present

## 2020-08-24 DIAGNOSIS — R7301 Impaired fasting glucose: Secondary | ICD-10-CM | POA: Diagnosis not present

## 2020-08-24 DIAGNOSIS — Z125 Encounter for screening for malignant neoplasm of prostate: Secondary | ICD-10-CM | POA: Diagnosis not present

## 2020-08-24 DIAGNOSIS — R635 Abnormal weight gain: Secondary | ICD-10-CM | POA: Diagnosis not present

## 2020-08-24 DIAGNOSIS — E669 Obesity, unspecified: Secondary | ICD-10-CM | POA: Diagnosis not present

## 2020-08-24 DIAGNOSIS — I10 Essential (primary) hypertension: Secondary | ICD-10-CM | POA: Diagnosis not present

## 2020-08-24 DIAGNOSIS — Z6839 Body mass index (BMI) 39.0-39.9, adult: Secondary | ICD-10-CM | POA: Diagnosis not present

## 2020-09-03 ENCOUNTER — Telehealth: Payer: Self-pay | Admitting: Cardiology

## 2020-09-03 NOTE — Telephone Encounter (Signed)
It is perfectly fine to take this medication he should feel better with this medication.

## 2020-09-03 NOTE — Telephone Encounter (Signed)
Patient called stating his PCP prescribed to him thyroid medication.  He doesn't want to take it until he gets the okay from Dr. Bing Matter first.  He read the side effects of the medications.

## 2020-09-03 NOTE — Telephone Encounter (Signed)
Patient called stating his PCP prescribed to him thyroid medication.  He doesn't want to take it until he gets the okay from Dr. Bing Matter first. The medication he was prescribed is levothyroxine sodium. He is worried that if he begins taking this medication then he will never be able to stop it. Will route to Dr. Kirtland Bouchard for advisement.

## 2020-09-03 NOTE — Telephone Encounter (Signed)
Spoke with the patients wife and informed her that Dr. Kirtland Bouchard advised it would be perfectly fine if the patient began taking Levothyroxine.She verbalized understanding.

## 2020-09-05 ENCOUNTER — Telehealth (HOSPITAL_COMMUNITY): Payer: Self-pay | Admitting: *Deleted

## 2020-09-05 NOTE — Telephone Encounter (Signed)
Patient given detailed instructions per Myocardial Perfusion Study Information Sheet for the test on 09/13/20. Patient notified to arrive 15 minutes early and that it is imperative to arrive on time for appointment to keep from having the test rescheduled.  If you need to cancel or reschedule your appointment, please call the office within 24 hours of your appointment. . Patient verbalized understanding. Joshua Thornton    

## 2020-09-13 ENCOUNTER — Ambulatory Visit (INDEPENDENT_AMBULATORY_CARE_PROVIDER_SITE_OTHER): Payer: Medicare HMO

## 2020-09-13 ENCOUNTER — Other Ambulatory Visit: Payer: Self-pay

## 2020-09-13 DIAGNOSIS — I472 Ventricular tachycardia: Secondary | ICD-10-CM | POA: Diagnosis not present

## 2020-09-13 DIAGNOSIS — I7121 Aneurysm of the ascending aorta, without rupture: Secondary | ICD-10-CM

## 2020-09-13 DIAGNOSIS — I712 Thoracic aortic aneurysm, without rupture: Secondary | ICD-10-CM

## 2020-09-13 DIAGNOSIS — I420 Dilated cardiomyopathy: Secondary | ICD-10-CM | POA: Diagnosis not present

## 2020-09-13 DIAGNOSIS — R0600 Dyspnea, unspecified: Secondary | ICD-10-CM

## 2020-09-13 DIAGNOSIS — I10 Essential (primary) hypertension: Secondary | ICD-10-CM

## 2020-09-13 DIAGNOSIS — R0609 Other forms of dyspnea: Secondary | ICD-10-CM

## 2020-09-13 DIAGNOSIS — I4729 Other ventricular tachycardia: Secondary | ICD-10-CM

## 2020-09-13 LAB — MYOCARDIAL PERFUSION IMAGING
LV dias vol: 130 mL (ref 62–150)
LV sys vol: 64 mL
Peak HR: 96 {beats}/min
Rest HR: 73 {beats}/min
SDS: 0
SRS: 13
SSS: 13
TID: 0.98

## 2020-09-13 MED ORDER — REGADENOSON 0.4 MG/5ML IV SOLN
0.4000 mg | Freq: Once | INTRAVENOUS | Status: AC
  Administered 2020-09-13: 0.4 mg via INTRAVENOUS

## 2020-09-13 MED ORDER — TECHNETIUM TC 99M TETROFOSMIN IV KIT
10.7000 | PACK | Freq: Once | INTRAVENOUS | Status: AC | PRN
  Administered 2020-09-13: 10.7 via INTRAVENOUS

## 2020-09-13 MED ORDER — TECHNETIUM TC 99M TETROFOSMIN IV KIT
29.5000 | PACK | Freq: Once | INTRAVENOUS | Status: AC | PRN
  Administered 2020-09-13: 29.5 via INTRAVENOUS

## 2020-09-17 DIAGNOSIS — J449 Chronic obstructive pulmonary disease, unspecified: Secondary | ICD-10-CM | POA: Diagnosis not present

## 2020-09-17 DIAGNOSIS — R062 Wheezing: Secondary | ICD-10-CM | POA: Diagnosis not present

## 2020-09-20 DIAGNOSIS — J449 Chronic obstructive pulmonary disease, unspecified: Secondary | ICD-10-CM | POA: Diagnosis not present

## 2020-12-10 DIAGNOSIS — Z139 Encounter for screening, unspecified: Secondary | ICD-10-CM | POA: Diagnosis not present

## 2020-12-10 DIAGNOSIS — E669 Obesity, unspecified: Secondary | ICD-10-CM | POA: Diagnosis not present

## 2020-12-10 DIAGNOSIS — Z9181 History of falling: Secondary | ICD-10-CM | POA: Diagnosis not present

## 2020-12-10 DIAGNOSIS — Z0000 Encounter for general adult medical examination without abnormal findings: Secondary | ICD-10-CM | POA: Diagnosis not present

## 2020-12-10 DIAGNOSIS — E785 Hyperlipidemia, unspecified: Secondary | ICD-10-CM | POA: Diagnosis not present

## 2020-12-10 DIAGNOSIS — Z1331 Encounter for screening for depression: Secondary | ICD-10-CM | POA: Diagnosis not present

## 2020-12-20 DIAGNOSIS — E039 Hypothyroidism, unspecified: Secondary | ICD-10-CM | POA: Diagnosis not present

## 2020-12-20 DIAGNOSIS — R7301 Impaired fasting glucose: Secondary | ICD-10-CM | POA: Diagnosis not present

## 2020-12-20 DIAGNOSIS — E785 Hyperlipidemia, unspecified: Secondary | ICD-10-CM | POA: Diagnosis not present

## 2020-12-20 DIAGNOSIS — I10 Essential (primary) hypertension: Secondary | ICD-10-CM | POA: Diagnosis not present

## 2020-12-20 DIAGNOSIS — J449 Chronic obstructive pulmonary disease, unspecified: Secondary | ICD-10-CM | POA: Diagnosis not present

## 2021-02-15 DIAGNOSIS — J069 Acute upper respiratory infection, unspecified: Secondary | ICD-10-CM | POA: Diagnosis not present

## 2021-04-25 DIAGNOSIS — R69 Illness, unspecified: Secondary | ICD-10-CM | POA: Diagnosis not present

## 2021-04-25 DIAGNOSIS — Z79899 Other long term (current) drug therapy: Secondary | ICD-10-CM | POA: Diagnosis not present

## 2021-04-25 DIAGNOSIS — E039 Hypothyroidism, unspecified: Secondary | ICD-10-CM | POA: Diagnosis not present

## 2021-04-25 DIAGNOSIS — R0600 Dyspnea, unspecified: Secondary | ICD-10-CM | POA: Diagnosis not present

## 2021-04-25 DIAGNOSIS — Z6837 Body mass index (BMI) 37.0-37.9, adult: Secondary | ICD-10-CM | POA: Diagnosis not present

## 2021-04-25 DIAGNOSIS — J449 Chronic obstructive pulmonary disease, unspecified: Secondary | ICD-10-CM | POA: Diagnosis not present

## 2021-04-25 DIAGNOSIS — I10 Essential (primary) hypertension: Secondary | ICD-10-CM | POA: Diagnosis not present

## 2021-05-13 DIAGNOSIS — E039 Hypothyroidism, unspecified: Secondary | ICD-10-CM | POA: Diagnosis not present

## 2021-05-13 DIAGNOSIS — M170 Bilateral primary osteoarthritis of knee: Secondary | ICD-10-CM | POA: Diagnosis not present

## 2021-05-13 DIAGNOSIS — I10 Essential (primary) hypertension: Secondary | ICD-10-CM | POA: Diagnosis not present

## 2021-05-13 DIAGNOSIS — Z6837 Body mass index (BMI) 37.0-37.9, adult: Secondary | ICD-10-CM | POA: Diagnosis not present

## 2021-05-13 DIAGNOSIS — J449 Chronic obstructive pulmonary disease, unspecified: Secondary | ICD-10-CM | POA: Diagnosis not present

## 2021-05-13 DIAGNOSIS — R69 Illness, unspecified: Secondary | ICD-10-CM | POA: Diagnosis not present

## 2021-06-11 DIAGNOSIS — Z20828 Contact with and (suspected) exposure to other viral communicable diseases: Secondary | ICD-10-CM | POA: Diagnosis not present

## 2021-06-11 DIAGNOSIS — J069 Acute upper respiratory infection, unspecified: Secondary | ICD-10-CM | POA: Diagnosis not present

## 2021-07-25 DIAGNOSIS — M25562 Pain in left knee: Secondary | ICD-10-CM | POA: Diagnosis not present

## 2021-07-25 DIAGNOSIS — M170 Bilateral primary osteoarthritis of knee: Secondary | ICD-10-CM | POA: Diagnosis not present

## 2021-07-25 DIAGNOSIS — M25561 Pain in right knee: Secondary | ICD-10-CM | POA: Diagnosis not present

## 2021-07-25 DIAGNOSIS — G8929 Other chronic pain: Secondary | ICD-10-CM | POA: Insufficient documentation

## 2021-09-10 DIAGNOSIS — Z7722 Contact with and (suspected) exposure to environmental tobacco smoke (acute) (chronic): Secondary | ICD-10-CM | POA: Diagnosis not present

## 2021-09-10 DIAGNOSIS — Z6837 Body mass index (BMI) 37.0-37.9, adult: Secondary | ICD-10-CM | POA: Diagnosis not present

## 2021-09-10 DIAGNOSIS — Z008 Encounter for other general examination: Secondary | ICD-10-CM | POA: Diagnosis not present

## 2021-09-10 DIAGNOSIS — J449 Chronic obstructive pulmonary disease, unspecified: Secondary | ICD-10-CM | POA: Diagnosis not present

## 2021-09-10 DIAGNOSIS — M1990 Unspecified osteoarthritis, unspecified site: Secondary | ICD-10-CM | POA: Diagnosis not present

## 2021-09-10 DIAGNOSIS — E039 Hypothyroidism, unspecified: Secondary | ICD-10-CM | POA: Diagnosis not present

## 2021-09-10 DIAGNOSIS — I10 Essential (primary) hypertension: Secondary | ICD-10-CM | POA: Diagnosis not present

## 2021-09-10 DIAGNOSIS — G8929 Other chronic pain: Secondary | ICD-10-CM | POA: Diagnosis not present

## 2021-09-10 DIAGNOSIS — Z803 Family history of malignant neoplasm of breast: Secondary | ICD-10-CM | POA: Diagnosis not present

## 2021-09-10 DIAGNOSIS — R69 Illness, unspecified: Secondary | ICD-10-CM | POA: Diagnosis not present

## 2021-11-01 DIAGNOSIS — Z23 Encounter for immunization: Secondary | ICD-10-CM | POA: Diagnosis not present

## 2022-03-06 DIAGNOSIS — J410 Simple chronic bronchitis: Secondary | ICD-10-CM | POA: Insufficient documentation

## 2022-03-06 DIAGNOSIS — E039 Hypothyroidism, unspecified: Secondary | ICD-10-CM | POA: Insufficient documentation

## 2022-03-06 DIAGNOSIS — E031 Congenital hypothyroidism without goiter: Secondary | ICD-10-CM

## 2022-04-16 DIAGNOSIS — M25473 Effusion, unspecified ankle: Secondary | ICD-10-CM

## 2022-04-16 DIAGNOSIS — H9313 Tinnitus, bilateral: Secondary | ICD-10-CM | POA: Insufficient documentation

## 2022-04-16 DIAGNOSIS — R222 Localized swelling, mass and lump, trunk: Secondary | ICD-10-CM | POA: Insufficient documentation

## 2022-04-16 DIAGNOSIS — R3911 Hesitancy of micturition: Secondary | ICD-10-CM | POA: Insufficient documentation

## 2022-05-16 DIAGNOSIS — R7301 Impaired fasting glucose: Secondary | ICD-10-CM | POA: Insufficient documentation

## 2022-05-16 DIAGNOSIS — E785 Hyperlipidemia, unspecified: Secondary | ICD-10-CM | POA: Insufficient documentation

## 2022-05-16 DIAGNOSIS — R7989 Other specified abnormal findings of blood chemistry: Secondary | ICD-10-CM | POA: Insufficient documentation

## 2022-05-19 DIAGNOSIS — D170 Benign lipomatous neoplasm of skin and subcutaneous tissue of head, face and neck: Secondary | ICD-10-CM | POA: Insufficient documentation

## 2023-03-11 DIAGNOSIS — E871 Hypo-osmolality and hyponatremia: Secondary | ICD-10-CM | POA: Insufficient documentation

## 2023-05-07 DIAGNOSIS — D509 Iron deficiency anemia, unspecified: Secondary | ICD-10-CM | POA: Insufficient documentation

## 2023-05-13 ENCOUNTER — Encounter: Payer: Self-pay | Admitting: Cardiology

## 2023-05-13 DIAGNOSIS — I7121 Aneurysm of the ascending aorta, without rupture: Secondary | ICD-10-CM

## 2023-06-24 ENCOUNTER — Encounter: Payer: Self-pay | Admitting: Cardiology

## 2023-06-24 ENCOUNTER — Ambulatory Visit: Payer: No Typology Code available for payment source | Attending: Cardiology | Admitting: Cardiology

## 2023-06-24 VITALS — BP 132/74 | HR 71 | Ht 69.0 in | Wt 227.2 lb

## 2023-06-24 DIAGNOSIS — I10 Essential (primary) hypertension: Secondary | ICD-10-CM

## 2023-06-24 DIAGNOSIS — I420 Dilated cardiomyopathy: Secondary | ICD-10-CM

## 2023-06-24 DIAGNOSIS — I7121 Aneurysm of the ascending aorta, without rupture: Secondary | ICD-10-CM | POA: Diagnosis not present

## 2023-06-24 DIAGNOSIS — I4729 Other ventricular tachycardia: Secondary | ICD-10-CM | POA: Diagnosis not present

## 2023-06-24 NOTE — Progress Notes (Signed)
Cardiology Consultation:    Date:  06/24/2023   ID:  Joshua, Thornton [birth year], MRN [id number]  PCP:  Paulina Fusi, MD  Cardiologist:  Gypsy Balsam, MD   Referring MD: Leane Call, PA-C   Chief Complaint  Patient presents with   Hypertension    History of Present Illness:    Joshua Thornton is a 72 y.o. male who is being seen today for the evaluation of hypertension at the request of Nodal, Joline Salt, PA-C.  Medical history significant for essential hypertension, cardiomyopathy ejection fraction 45%, ascending aortic aneurysm measuring up to 45 mm I did see him Furacin ago in the office investigation of the time involved discovering of his nonsustained ventricular tachycardia as well as cardiomyopathy.  Then he was lost from follow-up.  He is also alcoholic however in April decided to stop drinking he has been sober since that time he went to his primary care physician he want to be checked from cardiac standpoint review.  He denies have any chest pain tightness squeezing pressure burning chest.  He described to have weakness fatigue tiredness and shortness of breath.  Also he described to have some dizzy spell but that was related most likely his blood pressure being low after he stopped drinking alcohol his blood pressure dipped and some of the medication had to be withdrawn.  After discontinuation of this medication he denies having any dizziness or passing out.  Obviously he will concern about his health and he would like to be checked.  He is taking care of his sick wife she does have advanced COPD she is a smoker he never smoked.  Denies have any typical chest pain tightness squeezing pressure burning chest.  Past Medical History:  Diagnosis Date   Aneurysm of ascending aorta without rupture (HCC)    Ankle edema 04/16/2022   Arthralgia of both knees 02/12/2015   Congenital hypothyroidism without goiter 03/06/2022   Dilated cardiomyopathy (HCC)  05/01/2020   Elevated LFTs 05/16/2022   Hyperlipidemia 05/16/2022   Hypertrophic toenail    Hyponatremia    Iron deficiency anemia    Nonsustained ventricular tachycardia (HCC) 06/21/2020   Primary hypertension    Primary osteoarthritis of left knee 03/15/2015   Snoring 07/22/2018    Past Surgical History:  Procedure Laterality Date   ORCHIECTOMY      Current Medications: Current Meds  Medication Sig   albuterol (VENTOLIN HFA) 108 (90 Base) MCG/ACT inhaler Inhale 2 puffs into the lungs every 6 (six) hours as needed for wheezing or shortness of breath.   gabapentin (NEURONTIN) 300 MG capsule Take 300 mg by mouth 3 (three) times daily.   hydrochlorothiazide (HYDRODIURIL) 12.5 MG tablet Take 12.5 mg by mouth daily.   levothyroxine (SYNTHROID) 50 MCG tablet Take 50 mcg by mouth daily before breakfast.   umeclidinium bromide (INCRUSE ELLIPTA) 62.5 MCG/ACT AEPB Inhale 1 puff into the lungs daily.   [DISCONTINUED] aspirin 81 MG EC tablet Take 81 mg by mouth daily.    [DISCONTINUED] olmesartan (BENICAR) 40 MG tablet Take 40 mg by mouth daily.    [DISCONTINUED] tiotropium (SPIRIVA HANDIHALER) 18 MCG inhalation capsule Place 18 mcg into inhaler and inhale daily.     Allergies:   Patient has no known allergies.   Social History   Socioeconomic History   Marital status: Married    Spouse name: Not on file   Number of children: Not on file   Years of education: Not on file  Highest education level: Not on file  Occupational History   Not on file  Tobacco Use   Smoking status: Never   Smokeless tobacco: Never  Vaping Use   Vaping status: Never Used  Substance and Sexual Activity   Alcohol use: Yes    Alcohol/week: 42.0 standard drinks of alcohol    Types: 42 Cans of beer per week   Drug use: Never   Sexual activity: Not on file  Other Topics Concern   Not on file  Social History Narrative   Not on file   Social Determinants of Health   Financial Resource Strain: Not on  file  Food Insecurity: Not on file  Transportation Needs: Not on file  Physical Activity: Not on file  Stress: Not on file  Social Connections: Not on file     Family History: The patient's family history includes Alzheimer's disease in his father; Heart Problems in his sister; Stroke in his mother. ROS:   Please see the history of present illness.    All 14 point review of systems negative except as described per history of present illness.  EKGs/Labs/Other Studies Reviewed:    The following studies were reviewed today:   EKG: Normal sinus rhythm, PVCs, right bundle branch block, nonspecific ST segment changes     Recent Labs: No results found for requested labs within last 365 days.  Recent Lipid Panel No results found for: "CHOL", "TRIG", "HDL", "CHOLHDL", "VLDL", "LDLCALC", "LDLDIRECT"  Physical Exam:    VS:  BP 132/74 (BP Location: Left Arm, Patient Position: Sitting)   Pulse 71   Ht 5\' 9"  (1.753 m)   Wt 227 lb 3.2 oz (103.1 kg)   SpO2 95%   BMI 33.55 kg/m     Wt Readings from Last 3 Encounters:  06/24/23 227 lb 3.2 oz (103.1 kg)  09/13/20 273 lb (123.8 kg)  06/21/20 (!) 273 lb (123.8 kg)     GEN:  Well nourished, well developed in no acute distress HEENT: Normal NECK: No JVD; No carotid bruits LYMPHATICS: No lymphadenopathy CARDIAC: RRR, no murmurs, no rubs, no gallops RESPIRATORY:  Clear to auscultation without rales, wheezing or rhonchi  ABDOMEN: Soft, non-tender, non-distended MUSCULOSKELETAL:  No edema; No deformity  SKIN: Warm and dry NEUROLOGIC:  Alert and oriented x 3 PSYCHIATRIC:  Normal affect   ASSESSMENT:    1. Hypertension, unspecified type   2. Aneurysm of ascending aorta without rupture (HCC)   3. Dilated cardiomyopathy (HCC)   4. Essential hypertension   5. Nonsustained ventricular tachycardia (HCC)    PLAN:    In order of problems listed above:  History of dilated cardiomyopathy will repeat echocardiogram to recheck left  ventricle ejection fraction based on that we know how aggressive we need to be with management of this problem.  Etiology is unclear could be related to alcohol could be ischemic but again lets repeat echocardiogram first as he was left ventricle ejection fraction is. Ascending arctic aneurysm.  I will ask him to have echocardiogram done to assess left ventricle ejection fraction which allowed me also to look at the proximal portion of the artery, however, he will also be scheduled to have CT angio of his chest to rule out significant enlargement of the aorta.  I do not see any investigation of this abdominal aortic, therefore, I will schedule him to have abdominal ultrasound to determine the size of the abdominal aorta. Essential hypertension after withdrawal his alcohol he is blood pressure seems to be low.  But again we need to check echocardiogram make sure ejection fraction is normal is not responsible for drop in the blood pressure. Nonsustained ventricular tachycardia denies having any dizziness or passing out after modification of his medication.  If his ejection fraction will be diminished we will may consider monitor to see if he gets still recurrences of arrhythmia   Medication Adjustments/Labs and Tests Ordered: Current medicines are reviewed at length with the patient today.  Concerns regarding medicines are outlined above.  Orders Placed This Encounter  Procedures   EKG 12-Lead   No orders of the defined types were placed in this encounter.   Signed, Georgeanna Lea, MD, [facility]. 06/24/2023 2:19 PM    [facility]

## 2023-06-24 NOTE — Patient Instructions (Signed)
Medication Instructions:  Your physician recommends that you continue on your current medications as directed. Please refer to the Current Medication list given to you today.  *If you need a refill on your cardiac medications before your next appointment, please call your pharmacy*   Lab Work: None Ordered If you have labs (blood work) drawn today and your tests are completely normal, you will receive your results only by: MyChart Message (if you have MyChart) OR A paper copy in the mail If you have any lab test that is abnormal or we need to change your treatment, we will call you to review the results.   Testing/Procedures: Non-Cardiac CT Angiography (CTA), is a special type of CT scan that uses a computer to produce multi-dimensional views of major blood vessels throughout the body. In CT angiography, a contrast material is injected through an IV to help visualize the blood vessels   Your physician has requested that you have an echocardiogram. Echocardiography is a painless test that uses sound waves to create images of your heart. It provides your doctor with information about the size and shape of your heart and how well your heart's chambers and valves are working. This procedure takes approximately one hour. There are no restrictions for this procedure. Please do NOT wear cologne, perfume, aftershave, or lotions (deodorant is allowed). Please arrive 15 minutes prior to your appointment time.  Your physician has requested that you have an abdominal aorta duplex. During this test, an ultrasound is used to evaluate the aorta. Allow 30 minutes for this exam. Do not eat after midnight the day before and avoid carbonated beverages    Follow-Up: At [facility], you and your health needs are our priority.  As part of our continuing mission to provide you with exceptional heart care, we have created designated Provider Care Teams.  These Care Teams include your primary Cardiologist  (physician) and Advanced Practice Providers (APPs -  Physician Assistants and Nurse Practitioners) who all work together to provide you with the care you need, when you need it.  We recommend signing up for the patient portal called "MyChart".  Sign up information is provided on this After Visit Summary.  MyChart is used to connect with patients for Virtual Visits (Telemedicine).  Patients are able to view lab/test results, encounter notes, upcoming appointments, etc.  Non-urgent messages can be sent to your provider as well.   To learn more about what you can do with MyChart, go to [email or web address].    Your next appointment:   2 month(s)  The format for your next appointment:   In Person  Provider:   Gypsy Balsam, MD    Other Instructions NA

## 2023-06-24 NOTE — Addendum Note (Signed)
Addended by: Baldo Ash D on: 06/24/2023 02:47 PM   Modules accepted: Orders

## 2023-07-21 ENCOUNTER — Ambulatory Visit (INDEPENDENT_AMBULATORY_CARE_PROVIDER_SITE_OTHER): Payer: No Typology Code available for payment source

## 2023-07-21 ENCOUNTER — Ambulatory Visit: Payer: No Typology Code available for payment source | Attending: Cardiology

## 2023-07-21 DIAGNOSIS — I7121 Aneurysm of the ascending aorta, without rupture: Secondary | ICD-10-CM | POA: Diagnosis not present

## 2023-07-21 DIAGNOSIS — I420 Dilated cardiomyopathy: Secondary | ICD-10-CM

## 2023-07-21 LAB — ECHOCARDIOGRAM COMPLETE
Calc EF: 49 %
S' Lateral: 4.7 cm
Single Plane A2C EF: 41.5 %
Single Plane A4C EF: 57.1 %

## 2023-07-23 ENCOUNTER — Telehealth: Payer: Self-pay

## 2023-07-23 NOTE — Telephone Encounter (Signed)
Left VM per DPR per Dr. Vanetta Shawl note regarding normal [place] results. Routed to PCP.

## 2023-08-04 LAB — BASIC METABOLIC PANEL: EGFR: 60

## 2023-08-07 ENCOUNTER — Telehealth: Payer: Self-pay

## 2023-08-07 NOTE — Telephone Encounter (Signed)
Echo Results reviewed with pt as per Dr. Krasowski's note.  Pt verbalized understanding and had no additional questions. Routed to PCP 

## 2023-08-24 NOTE — Progress Notes (Unsigned)
Cardiology Office Note:  .   Date:  08/25/2023  ID:  Joshua Thornton, DOB [birth year], MRN [id number] PCP: Paulina Fusi, MD  [facility] Providers Cardiologist:  Gypsy Balsam, MD    History of Present Illness: .   Joshua Thornton is a 72 y.o. male with a past medical history of hypertension, ascending aortic aneurysm, dilated cardiomyopathy, NSVT, hypothyroidism, dyslipidemia, IDA, prior alcohol abuse.  Evaluated by Dr. Bing Matter on 06/24/2023, he was reestablishing care after not being seen for some time.  Repeat echocardiogram was arranged which revealed EF 45 to 50%, mild LVH, grade 1 DD, mild MR, moderate dilatation of the ascending aorta up to 44 mm.  AAA duplex was completed which revealed no evidence of aneurysm.  He presents today for follow-up of his hypertension.  He lost his wife a month ago, somewhat unexpectedly and has been dealing with that loss.  From a cardiac perspective, he is doing stable, he has some shortness of breath with exertion however he feels this is stable for him.  He has been steadily losing weight, he does have a good appetite but stopped drinking approximately 5 months ago. He denies chest pain, palpitations,  pnd, orthopnea, n, v, dizziness, syncope, edema, weight gain, or early satiety.    ROS: Review of Systems  Constitutional:  Positive for malaise/fatigue.  Respiratory:  Positive for shortness of breath.   All other systems reviewed and are negative.    Studies Reviewed: .        Cardiac Studies & Procedures     STRESS TESTS  MYOCARDIAL PERFUSION IMAGING 09/13/2020  Narrative  The left ventricular ejection fraction is mildly decreased (45-54%).  Nuclear stress EF: 51%.  There was no ST segment deviation noted during stress.  The study is normal.  This is a low risk study.   ECHOCARDIOGRAM  ECHOCARDIOGRAM COMPLETE 07/21/2023  Narrative ECHOCARDIOGRAM REPORT    Patient Name:   Joshua Thornton  Date of Exam: 07/21/2023 Medical Rec #:  [id number]              Height:       69.0 in Accession #:    [id number]             Weight:       227.2 lb Date of Birth:  11-11-1951              BSA:          2.181 m Patient Age:    72 years               BP:           132/74 mmHg Patient Gender: M                      HR:           72 bpm. Exam Location:  [facility]  Procedure: 2D Echo, Cardiac Doppler, Color Doppler and Strain Analysis  Indications:    Dilated cardiomyopathy (HCC) [I42.0 (ICD-10-CM)]  History:        Patient has prior history of Echocardiogram examinations, most recent 04/13/2020. Ascending aortic aneurysm, Arrythmias:Nonsustained ventricular tachycardia, Signs/Symptoms:Dizziness/Lightheadedness; Risk Factors:Hypertension.  Sonographer:    Louie Boston RDCS Referring Phys: [phone] Marveen Reeks KRASOWSKI  IMPRESSIONS   1. Left ventricular ejection fraction, by estimation, is 45 to 50%. The left ventricle has mildly decreased function. The left ventricle has no regional wall motion abnormalities. There is mild concentric left ventricular  hypertrophy. Left ventricular diastolic parameters are consistent with Grade I diastolic dysfunction (impaired relaxation). The average left ventricular global longitudinal strain is -14.6 %. The global longitudinal strain is abnormal. 2. Right ventricular systolic function is normal. The right ventricular size is normal. There is normal pulmonary artery systolic pressure. 3. The mitral valve is normal in structure. Mild mitral valve regurgitation. No evidence of mitral stenosis. 4. The aortic valve is tricuspid. Aortic valve regurgitation is not visualized. No aortic stenosis is present. 5. Aortic DTA 32 mm. There is moderate dilatation of the ascending aorta and of the aortic root, measuring 44 mm. 6. The inferior vena cava is normal in size with greater than 50% respiratory variability, suggesting right atrial pressure of 3 mmHg.  FINDINGS Left  Ventricle: Left ventricular ejection fraction, by estimation, is 45 to 50%. The left ventricle has mildly decreased function. The left ventricle has no regional wall motion abnormalities. The average left ventricular global longitudinal strain is -14.6 %. The global longitudinal strain is abnormal. The left ventricular internal cavity size was normal in size. There is mild concentric left ventricular hypertrophy. Left ventricular diastolic parameters are consistent with Grade I diastolic dysfunction (impaired relaxation). Normal left ventricular filling pressure.  Right Ventricle: The right ventricular size is normal. No increase in right ventricular wall thickness. Right ventricular systolic function is normal. There is normal pulmonary artery systolic pressure. The tricuspid regurgitant velocity is 2.17 m/s, and with an assumed right atrial pressure of 3 mmHg, the estimated right ventricular systolic pressure is 21.8 mmHg.  Left Atrium: Left atrial size was normal in size.  Right Atrium: Right atrial size was normal in size.  Pericardium: There is no evidence of pericardial effusion.  Mitral Valve: The mitral valve is normal in structure. Mild mitral valve regurgitation. No evidence of mitral valve stenosis.  Tricuspid Valve: The tricuspid valve is normal in structure. Tricuspid valve regurgitation is mild . No evidence of tricuspid stenosis.  Aortic Valve: The aortic valve is tricuspid. Aortic valve regurgitation is not visualized. No aortic stenosis is present.  Pulmonic Valve: The pulmonic valve was normal in structure. Pulmonic valve regurgitation is not visualized. No evidence of pulmonic stenosis.  Aorta: DTA 32 mm and the aortic arch was not well visualized. There is moderate dilatation of the ascending aorta and of the aortic root, measuring 44 mm.  Venous: The pulmonary veins were not well visualized. The inferior vena cava is normal in size with greater than 50% respiratory  variability, suggesting right atrial pressure of 3 mmHg.  IAS/Shunts: No atrial level shunt detected by color flow Doppler.   LEFT VENTRICLE PLAX 2D LVIDd:         5.90 cm      Diastology LVIDs:         4.70 cm      LV e' medial:    7.02 cm/s LV PW:         1.20 cm      LV E/e' medial:  7.2 LV IVS:        1.20 cm      LV e' lateral:   11.30 cm/s LVOT diam:     2.60 cm      LV E/e' lateral: 4.5 LV SV:         91 LV SV Index:   42           2D Longitudinal Strain LVOT Area:     5.31 cm     2D Strain GLS Avg:     -  14.6 %  LV Volumes (MOD) LV vol d, MOD A2C: 116.0 ml LV vol d, MOD A4C: 116.0 ml LV vol s, MOD A2C: 67.9 ml LV vol s, MOD A4C: 49.8 ml LV SV MOD A2C:     48.1 ml LV SV MOD A4C:     116.0 ml LV SV MOD BP:      57.4 ml  RIGHT VENTRICLE             IVC RV Basal diam:  3.40 cm     IVC diam: 1.80 cm RV S prime:     13.60 cm/s TAPSE (M-mode): 2.5 cm  LEFT ATRIUM             Index        RIGHT ATRIUM           Index LA diam:        3.90 cm 1.79 cm/m   RA Area:     20.80 cm LA Vol (A2C):   72.4 ml 33.19 ml/m  RA Volume:   61.10 ml  28.01 ml/m LA Vol (A4C):   64.9 ml 29.76 ml/m LA Biplane Vol: 71.3 ml 32.69 ml/m AORTIC VALVE LVOT Vmax:   68.20 cm/s LVOT Vmean:  46.700 cm/s LVOT VTI:    0.171 m  AORTA Ao Root diam: 4.00 cm Ao Asc diam:  4.35 cm Ao Desc diam: 3.20 cm  MV E velocity: 50.30 cm/s  TRICUSPID VALVE MV A velocity: 63.40 cm/s  TR Peak grad:   18.8 mmHg MV E/A ratio:  0.79        TR Vmax:        217.00 cm/s  SHUNTS Systemic VTI:  0.17 m Systemic Diam: 2.60 cm  Norman Herrlich MD Electronically signed by Norman Herrlich MD Signature Date/Time: 07/21/2023/12:38:24 PM    Final    MONITORS  LONG TERM MONITOR (3-14 DAYS) 06/07/2020  Narrative Joshua Thornton., DOB Apr 21, 1951, MRN [id number]  HOLTER MONITOR REPORT:    Date of test:                 05/08/2020 Duration of test:           6 days Indication:                    Palpitations Ordering  physician:  Georgeanna Lea, MD Referring physician:  Georgeanna Lea, MD   Baseline rhythm: Sinus  Minimum heart rate: 47 BPM.  Average heart rate: 81 BPM.  Maximal heart rate 139 BPM.  Atrial arrhythmia: Total of 3 SVTs noted fastest episode 4 beats at rate of 146 bpm, longest episode 16.3 seconds at rate of 126 bpm.  Ventricular arrhythmia: 13 episode of ventricular tachycardia noted fastest episode at rate of 214 bpm of 15 beats, total burden of PVCs was 10.1%  Conduction abnormality: First-degree AV block as well as intraventricular conduction delay noted  Symptoms: None   Conclusion: Multiple runs of ventricular tachycardia identified.  Interpreting  cardiologist: Gypsy Balsam, MD Date: 06/17/2020 7:25 PM           Risk Assessment/Calculations:             Physical Exam:   VS:  BP 130/62 (BP Location: Left Arm, Patient Position: Sitting)   Pulse 64   Ht 5\' 10"  (1.778 m)   Wt 208 lb 3.2 oz (94.4 kg)   SpO2 94%   BMI 29.87 kg/m    Wt Readings from Last 3 Encounters:  08/25/23 208 lb 3.2 oz (94.4 kg)  06/24/23 227 lb 3.2 oz (103.1 kg)  09/13/20 273 lb (123.8 kg)    GEN: Well nourished, well developed in no acute distress NECK: No JVD; No carotid bruits CARDIAC: RRR, no murmurs, rubs, gallops RESPIRATORY:  Clear to auscultation without rales, wheezing or rhonchi  ABDOMEN: Soft, non-tender, non-distended EXTREMITIES:  No edema; No deformity   ASSESSMENT AND PLAN: .   HFmrEF -most recent echo in July 2024 revealed EF 45 to 50%, NYHA class II, euvolemic.  Is hard to decipher if his symptoms are related to heart failure or the extreme stress he is under after recently losing his wife.  He does have some DOE, feels this is attributable to not using his inhaler.  He is not on any GDMT.  Was previously on losartan however his blood pressure kept dropping so this was eventually stopped.  We did discuss starting Jardiance today however he politely declined this,  does not want to add any medications at this time.  Hypertension-blood pressure today is controlled 130/62, yesterday morning it was 113/74.  He takes HCTZ 12.5 mg, and it drops his blood pressure substantially so he did not take it this morning.  Aneurysm of ascending aorta-stable at 4.4 cm per most recent echo, AAA duplex revealed no aneurysm.  Continue to maintain good blood pressure control   Dyslipidemia-most recent LDL on file for review in 1610 was elevated at 122 however he is not a lipid-lowering agent.  Offered to check his cholesterol today however he states he will see his PCP on the 10th is coming in for fasting blood work.  Requested that he asked them to send results to our office.  History of alcohol abuse-he has been alcohol free for approximately 5 months, congratulated him on this, encouraged continued cessation.    Dispo: Return in 3 months.  Signed, Flossie Dibble, NP

## 2023-08-25 ENCOUNTER — Ambulatory Visit: Payer: No Typology Code available for payment source | Attending: Cardiology | Admitting: Cardiology

## 2023-08-25 ENCOUNTER — Encounter: Payer: Self-pay | Admitting: Cardiology

## 2023-08-25 VITALS — BP 130/62 | HR 64 | Ht 70.0 in | Wt 208.2 lb

## 2023-08-25 DIAGNOSIS — I10 Essential (primary) hypertension: Secondary | ICD-10-CM | POA: Diagnosis not present

## 2023-08-25 DIAGNOSIS — I7121 Aneurysm of the ascending aorta, without rupture: Secondary | ICD-10-CM

## 2023-08-25 DIAGNOSIS — I420 Dilated cardiomyopathy: Secondary | ICD-10-CM

## 2023-08-25 DIAGNOSIS — I4729 Other ventricular tachycardia: Secondary | ICD-10-CM

## 2023-08-25 DIAGNOSIS — F1011 Alcohol abuse, in remission: Secondary | ICD-10-CM

## 2023-08-25 NOTE — Patient Instructions (Signed)
Medication Instructions:    *If you need a refill on your cardiac medications before your next appointment, please call your pharmacy*   Lab Work:   If you have labs (blood work) drawn today and your tests are completely normal, you will receive your results only by: MyChart Message (if you have MyChart) OR A paper copy in the mail If you have any lab test that is abnormal or we need to change your treatment, we will call you to review the results.   Testing/Procedures:     Follow-Up: At [facility], you and your health needs are our priority.  As part of our continuing mission to provide you with exceptional heart care, we have created designated Provider Care Teams.  These Care Teams include your primary Cardiologist (physician) and Advanced Practice Providers (APPs -  Physician Assistants and Nurse Practitioners) who all work together to provide you with the care you need, when you need it.  We recommend signing up for the patient portal called "MyChart".  Sign up information is provided on this After Visit Summary.  MyChart is used to connect with patients for Virtual Visits (Telemedicine).  Patients are able to view lab/test results, encounter notes, upcoming appointments, etc.  Non-urgent messages can be sent to your provider as well.   To learn more about what you can do with MyChart, go to [email or web address].    Your next appointment:   3 month(s)  Provider:   Gypsy Balsam, MD    Other Instructions

## 2023-09-11 ENCOUNTER — Telehealth: Payer: Self-pay

## 2023-09-11 ENCOUNTER — Encounter: Payer: Self-pay | Admitting: Cardiology

## 2023-09-11 NOTE — Telephone Encounter (Signed)
   [facility] Pre-operative Risk Assessment    Request for surgical clearance:  What type of surgery is being performed? 4 Extractions, crown and bridge work and/or dental cleaning  When is this surgery scheduled? TBD   What type of clearance is required (medical clearance vs. Pharmacy clearance to hold med vs. Both)? Both  Are there any medications that need to be held prior to surgery and how long?requesting pre medication instructions  Practice name and name of physician performing surgery?  [facility]  What is your office phone number : [phone]   7.   What is your office fax number: [phone]  8.   Anesthesia type (None, local, MAC, general) ? Not specified   Tiburcio Pea Marwa Fuhrman 09/11/2023, 8:40 AM  _________________________________________________________________   (provider comments below)

## 2023-09-14 NOTE — Telephone Encounter (Signed)
   Primary Cardiologist: Gypsy Balsam, MD  Chart reviewed as part of pre-operative protocol coverage. Given past medical history and time since last visit, based on ACC/AHA guidelines, Joshua Thornton would be at acceptable risk for the planned procedure without further cardiovascular testing.   Patient was advised that if he develops new symptoms prior to surgery to contact our office to arrange a follow-up appointment.  He verbalized understanding.  I will route this recommendation to the requesting party via Epic fax function and remove from pre-op pool.  Please call with questions.  Levi Aland, NP-C 09/14/2023, 7:26 AM [address] Office [phone] Fax [phone]

## 2023-09-18 ENCOUNTER — Encounter: Payer: Self-pay | Admitting: Cardiology

## 2023-10-01 ENCOUNTER — Telehealth: Payer: Self-pay

## 2023-10-01 NOTE — Telephone Encounter (Signed)
Unable to reach pt at both numbers in chart. Will send letter. Routed results to PCP.

## 2023-10-07 ENCOUNTER — Telehealth: Payer: Self-pay | Admitting: Cardiology

## 2023-10-07 ENCOUNTER — Telehealth: Payer: Self-pay

## 2023-10-07 NOTE — Telephone Encounter (Signed)
Pt called regarding CT and ECHO results. Pt could not be reached by phone and a letter was sent. Advised per Dr. Bing Matter to see his PCP to discuss the findings of nodules in his lungs and stomach. Sent results to Dr. Sherral Hammers per Pt request.

## 2023-10-07 NOTE — Telephone Encounter (Signed)
Follow Up:      I did not need this encounter 

## 2023-10-08 ENCOUNTER — Telehealth: Payer: Self-pay

## 2023-10-08 NOTE — Telephone Encounter (Signed)
Pt called regarding letter he received about results. Results reviewed with pt as per Dr. Vanetta Shawl note.  Pt verbalized understanding and had no additional questions. He stated that he would call his PCP and go over the CT results. Routed to PCP

## 2023-10-20 DIAGNOSIS — R7303 Prediabetes: Secondary | ICD-10-CM | POA: Insufficient documentation

## 2023-10-20 DIAGNOSIS — L989 Disorder of the skin and subcutaneous tissue, unspecified: Secondary | ICD-10-CM | POA: Insufficient documentation

## 2023-10-20 DIAGNOSIS — R590 Localized enlarged lymph nodes: Secondary | ICD-10-CM | POA: Insufficient documentation

## 2023-11-24 ENCOUNTER — Encounter: Payer: Self-pay | Admitting: Cardiology

## 2023-11-26 ENCOUNTER — Ambulatory Visit: Payer: No Typology Code available for payment source | Attending: Cardiology | Admitting: Cardiology

## 2023-11-26 ENCOUNTER — Encounter: Payer: Self-pay | Admitting: Cardiology

## 2023-11-26 VITALS — BP 130/58 | HR 61 | Ht 70.0 in | Wt 195.4 lb

## 2023-11-26 DIAGNOSIS — E782 Mixed hyperlipidemia: Secondary | ICD-10-CM

## 2023-11-26 DIAGNOSIS — I420 Dilated cardiomyopathy: Secondary | ICD-10-CM | POA: Diagnosis not present

## 2023-11-26 DIAGNOSIS — R0609 Other forms of dyspnea: Secondary | ICD-10-CM

## 2023-11-26 DIAGNOSIS — I7121 Aneurysm of the ascending aorta, without rupture: Secondary | ICD-10-CM | POA: Diagnosis not present

## 2023-11-26 DIAGNOSIS — I10 Essential (primary) hypertension: Secondary | ICD-10-CM | POA: Diagnosis not present

## 2023-11-26 NOTE — Patient Instructions (Signed)
Medication Instructions:  Your physician recommends that you continue on your current medications as directed. Please refer to the Current Medication list given to you today.  *If you need a refill on your cardiac medications before your next appointment, please call your pharmacy*   Lab Work: BMP, ProBNP-today If you have labs (blood work) drawn today and your tests are completely normal, you will receive your results only by: MyChart Message (if you have MyChart) OR A paper copy in the mail If you have any lab test that is abnormal or we need to change your treatment, we will call you to review the results.   Testing/Procedures: None Ordered   Follow-Up: At [facility], you and your health needs are our priority.  As part of our continuing mission to provide you with exceptional heart care, we have created designated Provider Care Teams.  These Care Teams include your primary Cardiologist (physician) and Advanced Practice Providers (APPs -  Physician Assistants and Nurse Practitioners) who all work together to provide you with the care you need, when you need it.  We recommend signing up for the patient portal called "MyChart".  Sign up information is provided on this After Visit Summary.  MyChart is used to connect with patients for Virtual Visits (Telemedicine).  Patients are able to view lab/test results, encounter notes, upcoming appointments, etc.  Non-urgent messages can be sent to your provider as well.   To learn more about what you can do with MyChart, go to [email or web address].    Your next appointment:   6 month(s)  The format for your next appointment:   In Person  Provider:   Robert Krasowski, MD    Other Instructions NA  

## 2023-11-26 NOTE — Progress Notes (Unsigned)
Cardiology Office Note:    Date:  11/26/2023   ID:  Joshua Thornton, Joshua Thornton 23-Nov-1951, MRN [id number]  PCP:  Hadley Pen, MD  Cardiologist:  Gypsy Balsam, MD    Referring MD: Paulina Fusi, MD   Chief Complaint  Patient presents with   Follow-up    History of Present Illness:    Joshua Thornton is a 72 y.o. male past medical history significant for essential hypertension, dilated cardiomyopathy ejection fraction 45 to 50%, aneurysm of the ascending aorta different measurements but last CT showed 40 mm, his abdominal aorta has been screening negative for aneurysm, in December he lost his wife, still somewhat shaken by that.  Denies have any chest pain tightness squeezing pressure burning chest and majority of time.  He said he is feeling fine.    Past Medical History:  Diagnosis Date   Aneurysm of ascending aorta without rupture (HCC)    Ankle edema 04/16/2022   Arthralgia of both knees 02/12/2015   Congenital hypothyroidism without goiter 03/06/2022   Dilated cardiomyopathy (HCC) 05/01/2020   Elevated LFTs 05/16/2022   Hyperlipidemia 05/16/2022   Hypertrophic toenail    Hyponatremia    Iron deficiency anemia    Nonsustained ventricular tachycardia (HCC) 06/21/2020   Primary hypertension    Primary osteoarthritis of left knee 03/15/2015   Snoring 07/22/2018    Past Surgical History:  Procedure Laterality Date   ORCHIECTOMY      Current Medications: Current Meds  Medication Sig   acetaminophen (TYLENOL) 650 MG CR tablet Take 1,300 mg by mouth daily.   albuterol (VENTOLIN HFA) 108 (90 Base) MCG/ACT inhaler Inhale 2 puffs into the lungs every 6 (six) hours as needed for wheezing or shortness of breath.   empagliflozin (JARDIANCE) 10 MG TABS tablet Take 1 tablet by mouth daily.   gabapentin (NEURONTIN) 300 MG capsule Take 300 mg by mouth as needed (Shingles).   levothyroxine (SYNTHROID) 50 MCG tablet Take 50 mcg by mouth daily before breakfast.    umeclidinium bromide (INCRUSE ELLIPTA) 62.5 MCG/ACT AEPB Inhale 1 puff into the lungs daily as needed (SOB).   [DISCONTINUED] hydrochlorothiazide (HYDRODIURIL) 12.5 MG tablet Take 12.5 mg by mouth daily.     Allergies:   Patient has no known allergies.   Social History   Socioeconomic History   Marital status: Married    Spouse name: Not on file   Number of children: Not on file   Years of education: Not on file   Highest education level: Not on file  Occupational History   Not on file  Tobacco Use   Smoking status: Never   Smokeless tobacco: Never  Vaping Use   Vaping status: Never Used  Substance and Sexual Activity   Alcohol use: Yes    Alcohol/week: 42.0 standard drinks of alcohol    Types: 42 Cans of beer per week   Drug use: Never   Sexual activity: Not on file  Other Topics Concern   Not on file  Social History Narrative   Not on file   Social Drivers of Health   Financial Resource Strain: Not on file  Food Insecurity: Not on file  Transportation Needs: Not on file  Physical Activity: Not on file  Stress: Not on file  Social Connections: Not on file     Family History: The patient's family history includes Alzheimer's disease in his father; Heart Problems in his sister; Stroke in his mother. ROS:   Please see the history of present  illness.    All 14 point review of systems negative except as described per history of present illness  EKGs/Labs/Other Studies Reviewed:         Recent Labs: No results found for requested labs within last 365 days.  Recent Lipid Panel No results found for: "CHOL", "TRIG", "HDL", "CHOLHDL", "VLDL", "LDLCALC", "LDLDIRECT"  Physical Exam:    VS:  BP (!) 130/58 (BP Location: Right Arm, Patient Position: Sitting)   Pulse 61   Ht 5\' 10"  (1.778 m)   Wt 195 lb 6.4 oz (88.6 kg)   SpO2 97%   BMI 28.04 kg/m     Wt Readings from Last 3 Encounters:  11/26/23 195 lb 6.4 oz (88.6 kg)  08/25/23 208 lb 3.2 oz (94.4 kg)   06/24/23 227 lb 3.2 oz (103.1 kg)     GEN:  Well nourished, well developed in no acute distress HEENT: Normal NECK: No JVD; No carotid bruits LYMPHATICS: No lymphadenopathy CARDIAC: RRR, no murmurs, no rubs, no gallops RESPIRATORY:  Clear to auscultation without rales, wheezing or rhonchi  ABDOMEN: Soft, non-tender, non-distended MUSCULOSKELETAL:  No edema; No deformity  SKIN: Warm and dry LOWER EXTREMITIES: no swelling NEUROLOGIC:  Alert and oriented x 3 PSYCHIATRIC:  Normal affect   ASSESSMENT:    1. Dyspnea on exertion   2. Aneurysm of ascending aorta without rupture (HCC)   3. Dilated cardiomyopathy (HCC)   4. Essential hypertension   5. Mixed hyperlipidemia    PLAN:    In order of problems listed above:  Aneurysm of the ascending aorta measuring 40 to 45 mm different numbers by different measurements overall not getting larger we will continue monitoring. Essential hypertension blood pressure seems to be well-controlled continue present management. History of dilated cardiomyopathy ejection fraction 45-50 which is only mildly diminished I think he can benefit from ARB or ACE inhibitor or Entresto however he did have some difficulty before.  I will check his proBNP and Chem-7 decide if he can initiate some of the medication.  He was started with Jardiance which seems to be working well for him. Mixed dyslipidemia I have last results from January 2022 with LDL of 25 HDL 52.  He is going to his primary care physician at the end of January and he prefers them to do cholesterol profile which will wait for results.   Medication Adjustments/Labs and Tests Ordered: Current medicines are reviewed at length with the patient today.  Concerns regarding medicines are outlined above.  Orders Placed This Encounter  Procedures   Basic metabolic panel   Pro b natriuretic peptide (BNP)   Medication changes: No orders of the defined types were placed in this  encounter.   Signed, Georgeanna Lea, MD, [facility] 11/26/2023 11:43 AM    [facility]

## 2023-11-27 LAB — BASIC METABOLIC PANEL
BUN/Creatinine Ratio: 20 (ref 10–24)
BUN: 14 mg/dL (ref 8–27)
CO2: 24 mmol/L (ref 20–29)
Calcium: 9 mg/dL (ref 8.6–10.2)
Chloride: 103 mmol/L (ref 96–106)
Creatinine, Ser: 0.69 mg/dL — ABNORMAL LOW (ref 0.76–1.27)
Glucose: 94 mg/dL (ref 70–99)
Potassium: 4.5 mmol/L (ref 3.5–5.2)
Sodium: 139 mmol/L (ref 134–144)
eGFR: 97 mL/min/{1.73_m2} (ref >59)

## 2023-11-27 LAB — PRO B NATRIURETIC PEPTIDE: NT-Pro BNP: 515 pg/mL — ABNORMAL HIGH (ref 0–376)

## 2023-12-15 ENCOUNTER — Telehealth: Payer: Self-pay

## 2023-12-15 DIAGNOSIS — I10 Essential (primary) hypertension: Secondary | ICD-10-CM

## 2023-12-15 MED ORDER — LOSARTAN POTASSIUM 25 MG PO TABS: 25.0000 mg | ORAL_TABLET | Freq: Every day | ORAL | 0 refills | Status: DC

## 2023-12-15 NOTE — Telephone Encounter (Signed)
 Results reviewed with pt as per Dr. Vanetta Shawl note.  Pt verbalized understanding and had no additional questions. Losartan 25mg  1 tablet daily sent to pharmacy. BMP in 1 week. Routed to PCP

## 2024-05-01 DEATH — deceased
